# Patient Record
Sex: Male | Born: 1935 | Race: White | Hispanic: No | Smoking: Former smoker
Health system: Southern US, Community
[De-identification: ages and names within clinical notes are randomized; demographics above are authoritative.]

## PROBLEM LIST (undated history)

## (undated) DIAGNOSIS — I4891 Unspecified atrial fibrillation: Secondary | ICD-10-CM

## (undated) DIAGNOSIS — I472 Ventricular tachycardia: Secondary | ICD-10-CM

## (undated) DIAGNOSIS — I671 Cerebral aneurysm, nonruptured: Secondary | ICD-10-CM

## (undated) DIAGNOSIS — I10 Essential (primary) hypertension: Secondary | ICD-10-CM

## (undated) DIAGNOSIS — I509 Heart failure, unspecified: Secondary | ICD-10-CM

## (undated) DIAGNOSIS — I428 Other cardiomyopathies: Secondary | ICD-10-CM

## (undated) DIAGNOSIS — I442 Atrioventricular block, complete: Secondary | ICD-10-CM

## (undated) HISTORY — DX: Essential (primary) hypertension: I10

## (undated) HISTORY — DX: Unspecified atrial fibrillation: I48.91

## (undated) HISTORY — DX: Cerebral aneurysm, nonruptured: I67.1

## (undated) HISTORY — DX: Other cardiomyopathies: I42.8

## (undated) HISTORY — DX: Atrioventricular block, complete: I44.2

## (undated) HISTORY — DX: Heart failure, unspecified: I50.9

## (undated) HISTORY — PX: CARDIAC DEFIBRILLATOR PLACEMENT: SHX171

## (undated) HISTORY — DX: Ventricular tachycardia: I47.2

## (undated) HISTORY — PX: CEREBRAL ANEURYSM REPAIR: SHX164

---

## 1992-05-04 DIAGNOSIS — I671 Cerebral aneurysm, nonruptured: Secondary | ICD-10-CM

## 1992-05-04 HISTORY — DX: Cerebral aneurysm, nonruptured: I67.1

## 2005-03-31 ENCOUNTER — Ambulatory Visit: Payer: Self-pay | Admitting: Internal Medicine

## 2005-05-05 ENCOUNTER — Ambulatory Visit (HOSPITAL_COMMUNITY): Admission: RE | Admit: 2005-05-05 | Discharge: 2005-05-06 | Payer: Self-pay | Admitting: Internal Medicine

## 2005-05-05 ENCOUNTER — Ambulatory Visit: Payer: Self-pay | Admitting: Internal Medicine

## 2005-05-18 ENCOUNTER — Ambulatory Visit: Payer: Self-pay

## 2005-07-28 ENCOUNTER — Ambulatory Visit: Payer: Self-pay | Admitting: Internal Medicine

## 2005-10-26 ENCOUNTER — Ambulatory Visit: Payer: Self-pay | Admitting: Internal Medicine

## 2006-04-19 ENCOUNTER — Ambulatory Visit: Payer: Self-pay | Admitting: Cardiology

## 2006-09-24 ENCOUNTER — Ambulatory Visit: Payer: Self-pay | Admitting: Internal Medicine

## 2007-01-14 ENCOUNTER — Ambulatory Visit: Payer: Self-pay | Admitting: Internal Medicine

## 2007-04-22 ENCOUNTER — Ambulatory Visit: Payer: Self-pay | Admitting: Internal Medicine

## 2007-07-26 ENCOUNTER — Ambulatory Visit: Payer: Self-pay | Admitting: Internal Medicine

## 2007-10-24 ENCOUNTER — Ambulatory Visit: Payer: Self-pay | Admitting: Internal Medicine

## 2008-01-30 ENCOUNTER — Ambulatory Visit: Payer: Self-pay | Admitting: Internal Medicine

## 2008-05-25 ENCOUNTER — Ambulatory Visit: Payer: Self-pay | Admitting: Internal Medicine

## 2008-05-28 ENCOUNTER — Encounter: Payer: Self-pay | Admitting: Internal Medicine

## 2008-08-20 ENCOUNTER — Ambulatory Visit: Payer: Self-pay | Admitting: Internal Medicine

## 2008-08-30 ENCOUNTER — Encounter (INDEPENDENT_AMBULATORY_CARE_PROVIDER_SITE_OTHER): Payer: Self-pay | Admitting: *Deleted

## 2008-11-19 ENCOUNTER — Ambulatory Visit: Payer: Self-pay | Admitting: Internal Medicine

## 2008-11-19 ENCOUNTER — Encounter: Payer: Self-pay | Admitting: Internal Medicine

## 2008-11-21 ENCOUNTER — Encounter: Payer: Self-pay | Admitting: Internal Medicine

## 2009-01-30 DIAGNOSIS — Z95810 Presence of automatic (implantable) cardiac defibrillator: Secondary | ICD-10-CM | POA: Insufficient documentation

## 2009-01-30 DIAGNOSIS — I472 Ventricular tachycardia: Secondary | ICD-10-CM | POA: Insufficient documentation

## 2009-01-30 DIAGNOSIS — I429 Cardiomyopathy, unspecified: Secondary | ICD-10-CM | POA: Insufficient documentation

## 2009-01-30 DIAGNOSIS — I5022 Chronic systolic (congestive) heart failure: Secondary | ICD-10-CM | POA: Insufficient documentation

## 2009-02-18 ENCOUNTER — Ambulatory Visit: Payer: Self-pay | Admitting: Internal Medicine

## 2009-03-11 ENCOUNTER — Encounter: Payer: Self-pay | Admitting: Internal Medicine

## 2009-06-14 ENCOUNTER — Ambulatory Visit: Payer: Self-pay | Admitting: Internal Medicine

## 2009-06-14 DIAGNOSIS — I442 Atrioventricular block, complete: Secondary | ICD-10-CM | POA: Insufficient documentation

## 2009-06-14 DIAGNOSIS — I4891 Unspecified atrial fibrillation: Secondary | ICD-10-CM | POA: Insufficient documentation

## 2009-09-09 ENCOUNTER — Ambulatory Visit: Payer: Self-pay | Admitting: Internal Medicine

## 2009-09-11 ENCOUNTER — Encounter: Payer: Self-pay | Admitting: Internal Medicine

## 2009-12-12 ENCOUNTER — Ambulatory Visit: Payer: Self-pay | Admitting: Internal Medicine

## 2009-12-23 ENCOUNTER — Encounter: Payer: Self-pay | Admitting: Internal Medicine

## 2010-03-20 ENCOUNTER — Ambulatory Visit: Payer: Self-pay | Admitting: Internal Medicine

## 2010-03-26 ENCOUNTER — Encounter: Payer: Self-pay | Admitting: Internal Medicine

## 2010-06-03 NOTE — Assessment & Plan Note (Signed)
Summary: St. Jude device check  recv letter vs   Visit Type:  Pacemaker check Primary Provider:  Dr. Jonelle Sports  CC:  pacercheck.  History of Present Illness: The patient presents today for routine electrophysiology followup. He reports doing very well since last being seen in our clinic. The patient denies symptoms of palpitations, chest pain, shortness of breath, orthopnea, PND, lower extremity edema, dizziness, presyncope, syncope, or neurologic sequela. The patient is tolerating medications without difficulties and is otherwise without complaint today.   Preventive Screening-Counseling & Management  Alcohol-Tobacco     Smoking Status: quit     Year Quit: 1993  Current Medications (verified): 1)  Altace 10 Mg Caps (Ramipril) .... Take 1 Tablet By Mouth Once A Day 2)  Coreg 25 Mg Tabs (Carvedilol) .... Take 1 Tablet By Mouth Two Times A Day 3)  Glipizide 10 Mg Tabs (Glipizide) .... Take 1 Tablet By Mouth Once A Day 4)  Metformin Hcl 1000 Mg Tabs (Metformin Hcl) .... Take 1 Tablet By Mouth Once A Day 5)  Warfarin Sodium 5 Mg Tabs (Warfarin Sodium) .... Take 1 Tablet By Mouth Once A Day 6)  Vitamin D3 400 Unit Tabs (Cholecalciferol) .... Take 2 Tablet By Mouth Once A Day 7)  Vytorin 10-40 Mg Tabs (Ezetimibe-Simvastatin) .... Take 1 Tablet By Mouth Once A Day  Allergies (verified): 1)  ! Ancef  Comments:  Nurse/Medical Assistant: [name] medications and allergies were reviewed with the patient and were updated in the Medication and Allergy Lists. List reviewed.  Past History:  Past Medical History: 1. Nonischemic cardiomyopathy. 2. Nonsustained ventricular tachycardia. 3. Status post cardiac resynchronization therapy with defibrillator. 4. Chronic congestive heart failure - systolic now class I-II.  5. Afib 6. Complete heart block  Social History: Reviewed history from 01/30/2009 and no changes required. Retired  Married  Tobacco Use - Former.  Alcohol Use -  no Drug Use - no  Vital Signs:  Patient profile:   77 year old male Height:      69 inches Weight:      178 pounds BMI:     26.38 Pulse rate:   76 / minute BP sitting:   124 / 76  (left arm) Cuff size:   regular  Vitals Entered By: Carlye Grippe (June 14, 2009 2:24 PM)  Nutrition Counseling: Patient's BMI is greater than 25 and therefore counseled on weight management options. CC: pacercheck   Physical Exam  General:  Well developed, well nourished, in no acute distress. Head:  normocephalic and atraumatic Eyes:  PERRLA/EOM intact; conjunctiva and lids normal. Mouth:  Teeth, gums and palate normal. Oral mucosa normal. Neck:  Neck supple, no JVD. No masses, thyromegaly or abnormal cervical nodes. Lungs:  Clear bilaterally to auscultation and percussion. Heart:  RRR, no m/r/g Abdomen:  Bowel sounds positive; abdomen soft and non-tender without masses, organomegaly, or hernias noted. No hepatosplenomegaly. Msk:  Back normal, normal gait. Muscle strength and tone normal. Pulses:  pulses normal in all 4 extremities Extremities:  No clubbing or cyanosis. Neurologic:  Alert and oriented x 3. Skin:  Intact without lesions or rashes. Cervical Nodes:  no significant adenopathy Psych:  Normal affect.    ICD Specifications Following MD:  Hillis Range, MD     ICD Vendor:  St Jude     ICD Model Number:  [id number]     ICD Serial Number:  [id number] ICD DOI:  05/05/2005     ICD Implanting MD:  Sherryl Manges, MD  Lead  1:    Location: RA     DOI: 05/05/2005     Model #: [id number]     Serial #: [id number]     Status: active Lead 2:    Location: RV     DOI: 05/05/2005     Model #: 7001     Serial #: [id number]     Status: active Lead 3:    Location: LV     DOI: 05/05/2004     Model #: 5621     Serial #: [id number]     Status: active  Indications::  NICM, NSVT   ICD Follow Up Remote Check?  No Battery Voltage:  2.56 V     Charge Time:  13.3 seconds     Underlying rhythm:  CHB ICD Dependent:  Yes        ICD Device Measurements Atrium:  Amplitude: 3 mV, Impedance: 480 ohms, Threshold: 0.75 V at 0.5 msec Right Ventricle:  Amplitude: PACED AT 30 mV, Impedance: 370 ohms, Threshold: 1.0 V at 0.5 msec Left Ventricle:  Impedance: 450 ohms, Threshold: 1.0 V at 0.5 msec Shock Impedance: 41 ohms   Episodes MS Episodes:  1     Percent Mode Switch:  <1%     Coumadin:  Yes Shock:  0     ATP:  0     Nonsustained:  0     Atrial Pacing:  9%     Ventricular Pacing:  >99%  Brady Parameters Mode DDDR     Lower Rate Limit:  60     Upper Rate Limit 120 PAV 200     Sensed AV Delay:  160  Tachy Zones VF:  222     VT:  200     VT1:  160     Next Remote Date:  09/09/2009     Next Cardiology Appt Due:  06/04/2010 Tech Comments:  Normal device function.  No changes made today.  Pt does Merlin transmisisons.  ROV 12 months JA. Gypsy Balsam RN BSN  June 14, 2009 2:38 PM  MD Comments:  agree  Impression & Recommendations:  Problem # 1:  ICD - IN SITU (ICD-V45.02) Normal BiV ICD function. No changes today.  Problem # 2:  SYSTOLIC HEART FAILURE, CHRONIC (ICD-428.22) stable without symptoms of CHF. Continue med managment His updated medication list for this problem includes:    Altace 10 Mg Caps (Ramipril) .Marland Kitchen... Take 1 tablet by mouth once a day    Coreg 25 Mg Tabs (Carvedilol) .Marland Kitchen... Take 1 tablet by mouth two times a day    Warfarin Sodium 5 Mg Tabs (Warfarin sodium) .Marland Kitchen... Take 1 tablet by mouth once a day  Problem # 3:  AV BLOCK, COMPLETE (ICD-426.0) stable with BiV pacing.  His updated medication list for this problem includes:    Altace 10 Mg Caps (Ramipril) .Marland Kitchen... Take 1 tablet by mouth once a day    Coreg 25 Mg Tabs (Carvedilol) .Marland Kitchen... Take 1 tablet by mouth two times a day    Warfarin Sodium 5 Mg Tabs (Warfarin sodium) .Marland Kitchen... Take 1 tablet by mouth once a day  Problem # 4:  ATRIAL FIBRILLATION (ICD-427.31) stable continue coumadin. no significant Afib by ICD interrogation. His updated  medication list for this problem includes:    Coreg 25 Mg Tabs (Carvedilol) .Marland Kitchen... Take 1 tablet by mouth two times a day    Warfarin Sodium 5 Mg Tabs (Warfarin sodium) .Marland Kitchen... Take 1 tablet by mouth once a day  Patient Instructions: 1)  continue close follow-up with Dr Hyacinth Meeker. 2)  remote device checks every 3 months 3)  return in 1 year

## 2010-06-03 NOTE — Letter (Signed)
Summary: Remote Device Check  [facility], Main Office  [address]   [place], [place] [address]   Phone: [phone]  Fax: [phone]     March 26, 2010 MRN: [id number]   George Medina [address] [place], [place]  [address]   Dear Mr. Sullivant,   Your remote transmission was recieved and reviewed by your physician.  All diagnostics were within normal limits for you.   __X____Your next office visit is scheduled for:   February 2012 with Dr Johney Frame in [place]. Please call our office to schedule an appointment.    Sincerely,  Vella Kohler

## 2010-06-03 NOTE — Cardiovascular Report (Signed)
Summary: Office Visit Remote   Office Visit Remote   Imported By: Roderic Ovens 04/07/2010 14:48:37  _____________________________________________________________________  External Attachment:    Type:   Image     Comment:   External Document

## 2010-06-03 NOTE — Cardiovascular Report (Signed)
Summary: Office Visit Remote   Office Visit Remote   Imported By: Roderic Ovens 12/24/2009 15:37:15  _____________________________________________________________________  External Attachment:    Type:   Image     Comment:   External Document

## 2010-06-03 NOTE — Cardiovascular Report (Signed)
Summary: Card Device Clinic/ [facility] SUMMARY  Card Device Clinic/ [facility] SUMMARY   Imported By: Dorise Hiss 06/17/2009 11:48:31  _____________________________________________________________________  External Attachment:    Type:   Image     Comment:   External Document

## 2010-06-03 NOTE — Cardiovascular Report (Signed)
Summary: Office Visit Remote   Office Visit Remote   Imported By: Roderic Ovens 09/11/2009 13:38:01  _____________________________________________________________________  External Attachment:    Type:   Image     Comment:   External Document

## 2010-06-03 NOTE — Letter (Signed)
Summary: Remote Device Check  [facility], Main Office  [address]   [place], [place] [address]   Phone: [phone]  Fax: [phone]     Sep 11, 2009 MRN: [id number]   George Medina [address] [place], [place]  [address]   Dear [name],   Your remote transmission was recieved and reviewed by your physician.  All diagnostics were within normal limits for you.  __X___Your next transmission is scheduled for:   12-12-09.  Please transmit at any time this day.  If you have a wireless device your transmission will be sent automatically.    Sincerely,  Vella Kohler

## 2010-06-03 NOTE — Letter (Signed)
Summary: Remote Device Check  [facility], Main Office  [address]   [place], [place] [address]   Phone: [phone]  Fax: [phone]     December 23, 2009 MRN: [id number]   George Medina [address] [place], [place]  [address]   Dear George Medina,   Your remote transmission was recieved and reviewed by your physician.  All diagnostics were within normal limits for you.  __X___Your next transmission is scheduled for: 03-20-2010.  Please transmit at any time this day.  If you have a wireless device your transmission will be sent automatically.   Sincerely,  Vella Kohler

## 2010-06-23 ENCOUNTER — Encounter: Payer: Self-pay | Admitting: Internal Medicine

## 2010-06-23 ENCOUNTER — Encounter (INDEPENDENT_AMBULATORY_CARE_PROVIDER_SITE_OTHER): Payer: Medicare Other | Admitting: Internal Medicine

## 2010-06-23 DIAGNOSIS — I442 Atrioventricular block, complete: Secondary | ICD-10-CM

## 2010-06-23 DIAGNOSIS — I10 Essential (primary) hypertension: Secondary | ICD-10-CM | POA: Insufficient documentation

## 2010-06-23 DIAGNOSIS — I5022 Chronic systolic (congestive) heart failure: Secondary | ICD-10-CM

## 2010-06-23 DIAGNOSIS — I428 Other cardiomyopathies: Secondary | ICD-10-CM

## 2010-06-23 DIAGNOSIS — I4891 Unspecified atrial fibrillation: Secondary | ICD-10-CM

## 2010-07-01 NOTE — Assessment & Plan Note (Signed)
Summary: SJM.Marland KitchenICD/LG   Visit Type:  Follow-up Primary Provider:  Dr. Jonelle Sports  CC:  ICD.  History of Present Illness: The patient presents today for routine electrophysiology followup. He reports doing very well since last being seen in our clinic. The patient denies symptoms of palpitations, chest pain, shortness of breath, orthopnea, PND, lower extremity edema, dizziness, presyncope, syncope, or neurologic sequela. The patient is tolerating medications without difficulties and is otherwise without complaint today.   Current Medications (verified): 1)  Altace 10 Mg Caps (Ramipril) .... Take 1 Tablet By Mouth Once A Day 2)  Coreg 25 Mg Tabs (Carvedilol) .... Take 1 Tablet By Mouth Two Times A Day 3)  Glipizide 10 Mg Tabs (Glipizide) .... 1/2 By Mouth Daily 4)  Metformin Hcl 1000 Mg Tabs (Metformin Hcl) .... Take 1 Tablet By Mouth Once A Day 5)  Warfarin Sodium 5 Mg Tabs (Warfarin Sodium) .... Take 1 Tablet By Mouth Once A Day 6)  Vitamin D3 400 Unit Tabs (Cholecalciferol) .... Take 2 Tablet By Mouth Once A Day 7)  Januvia 100 Mg Tabs (Sitagliptin Phosphate) .Marland Kitchen.. 1 By Mouth Daily  Allergies (verified): 1)  ! Ancef  Past History:  Past Medical History: Reviewed history from 06/14/2009 and no changes required. 1. Nonischemic cardiomyopathy. 2. Nonsustained ventricular tachycardia. 3. Status post cardiac resynchronization therapy with defibrillator. 4. Chronic congestive heart failure - systolic now class I-II.  5. Afib 6. 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 Retired  Married  Tobacco Use - Former.  Alcohol Use - no Drug Use - no  Review of Systems       All systems are reviewed and negative except as listed in the HPI.   Vital Signs:  Patient profile:   77 year old male Height:      69 inches Weight:      181 pounds BMI:     26.83 Pulse  rate:   90 / minute Resp:     16 per minute BP sitting:   151 / 75  (left arm)  Vitals Entered By: Marrion Coy, CNA (June 23, 2010 3:44 PM)  Physical Exam  General:  Well developed, well nourished, in no acute distress. Head:  normocephalic and atraumatic Eyes:  PERRLA/EOM intact; conjunctiva and lids normal. Mouth:  Teeth, gums and palate normal. Oral mucosa normal. Neck:  supple Chest Wall:  ICD pocket is well healed Lungs:  Clear bilaterally to auscultation and percussion. Heart:  Non-displaced PMI, chest non-tender; regular rate and rhythm, S1, S2 without murmurs, rubs or gallops. Carotid upstroke normal, no bruit. Normal abdominal aortic size, no bruits. Femorals normal pulses, no bruits. Pedals normal pulses. No edema, no varicosities. Abdomen:  Bowel sounds positive; abdomen soft and non-tender without masses, organomegaly, or hernias noted. No hepatosplenomegaly. Msk:  Back normal, normal gait. Muscle strength and tone normal. Extremities:  No clubbing or cyanosis. Neurologic:  Alert and oriented x 3. Skin:  Intact without lesions or rashes. Psych:  Normal affect.    ICD Specifications Following MD:  Hillis Range, MD     ICD Vendor:  St Jude     ICD Model Number:  [id number]     ICD Serial Number:  [id number] ICD DOI:  05/05/2005     ICD Implanting MD:  Sherryl Manges, MD  Lead 1:    Location: RA     DOI: 05/05/2005  Model #: [id number]     Serial #: [id number]     Status: active Lead 2:    Location: RV     DOI: 05/05/2005     Model #: 7001     Serial #: [id number]     Status: active Lead 3:    Location: LV     DOI: 05/05/2004     Model #: 5409     Serial #: [id number]     Status: active  Indications::  NICM, NSVT   ICD Follow Up ICD Dependent:  Yes      Episodes Coumadin:  Yes  Brady Parameters Mode DDDR     Lower Rate Limit:  60     Upper Rate Limit 120 PAV 200     Sensed AV Delay:  160  Tachy Zones VF:  222     VT:  200     VT1:  160     MD Comments:  see scanned  report  Impression & Recommendations:  Problem # 1:  SYSTOLIC HEART FAILURE, CHRONIC (ICD-428.22) stable without sypmtomats of CHF today ICD function is normal but approaching ERI.  No changes today.  We will follow with monthly merlin checks and have pt return to our clinic in 3 months.  He has a SJM Riata model 7001 lead.  We will need to make a decision about whether to replace this lead prior to generator change.  Problem # 2:  AV BLOCK, COMPLETE (ICD-426.0) normal device function no changes today  Problem # 3:  ATRIAL FIBRILLATION (ICD-427.31) stable maintaining sinus rhythm goal INR 2-3  Problem # 4:  ESSENTIAL HYPERTENSION, BENIGN (ICD-401.1) above goal salt restriction advised

## 2010-07-01 NOTE — Cardiovascular Report (Signed)
Summary: Card Device Clinic/ [facility] SUMMARY  Card Device Clinic/ [facility] SUMMARY   Imported By: Dorise Hiss 06/24/2010 16:52:28  _____________________________________________________________________  External Attachment:    Type:   Image     Comment:   External Document

## 2010-09-16 NOTE — Cardiovascular Report (Signed)
[facility]                   [place] ELECTROPHYSIOLOGY DEVICE CLINIC NOTE   ARDA, KEADLE                         MRN:          [id number]  DATE:05/25/2008                            DOB:          Sep 05, 1929    Mr. George Medina is seen in followup for congestive heart failure in the  setting of nonischemic cardiomyopathy.  He is status post CRTD  implantation and is feeling really quite well.  He has no complaints of  chest pain, shortness of breath, or peripheral edema.   His medications include Coumadin, Coreg, metformin, glipizide, Altace,  Vytorin, furosemide.   PHYSICAL EXAMINATION:  VITAL SIGNS:  His blood pressure today was  129/74, the pulse of 74.  LUNGS:  Clear.  HEART:  Sounds were regular.  EXTREMITIES:  Without edema.   Interrogation of his St. Jude Ellis ICD demonstrates the P-wave of 3  with impedance of 465, a threshold of 0.5 at 0.5 in the RA and RV.  There is no intrinsic ventricular rhythm.  The RV impedance was 410, LV  impedance was 413, and the LV threshold was 0.75 at 0.5.  Battery  voltage 2.65.  He is 100% ventricularly paced.   IMPRESSION:  1. Nonischemic cardiomyopathy.  2. Nonsustained ventricular tachycardia.  3. Status post cardiac resynchronization therapy with defibrillator.  4. Chronic congestive heart failure - systolic now class I-II.   Mr. [name] is doing really very well.  We will plan to see him again in  1 year's time.  He will follow up with Dr. Hyacinth Meeker in the interim.     Duke Salvia, MD, [facility]  Electronically Signed    SCK/MedQ  DD: 05/25/2008  DT: 05/26/2008  Job #: [id number]   cc:   Jillyn Hidden Dr. Hyacinth Meeker

## 2010-09-16 NOTE — Letter (Signed)
January 14, 2007    Pincus Sanes, M.D.  [place], [place] [address]   RE:  NATHANIEL, WAKELEY  MRN:  [id number]  /  DOB:  17-Mar-1930   Dear Henreitta Cea:   I hope this letter finds you well.  It was a pleasure to see your  patient, Husain Costabile today.   As you know, he is status post CRT-D implantation in the context of his  nonischemic cardiomyopathy, congestive heart failure.  He continues to  thrive, now with class II symptoms.   MEDICATIONS:  1. Coreg 25 b.i.d.  2. Metformin 1000 b.i.d.  3. Glipizide 5.  4. Protonix/Altace 10.  5. Vytorin 40.  6. Furosemide 20.  7. Coumadin.   EXAMINATION:  Blood pressure 136/76, pulse 68.  Neck veins were flat.  LUNGS:  Clear.  HEART:  Sounds are regular without murmurs or gallops.  ABDOMEN:  Soft with active bowel sounds.  EXTREMITIES:  Without edema.   Interrogation of his [employer]ch[employer], retail defibrillator demonstrates no  intrinsic ventricular rhythm.  Impedance was 390 and threshold of 0.5 at  0.5 in the RV and 0.75 at 0.5 in the LV with a 485 LV impedance.  The RA  impedance was 440 with a threshold at 0.5 at 0.5 and P wave amplitude  was greater than 3.  There were no intercurrent episodes.   IMPRESSION:  1. Complete heart block.  2. Nonischemic cardiomyopathy.  3. Congestive heart failure higher class III.  4. Status post CRT-D for the above.   Mr. Averi, Cacioppo, is doing really very well.  If there is anything I  can do to help in his care of anything else, please do not hesitate to  contact me.    Sincerely,      Duke Salvia, MD, [facility]  Electronically Signed    SCK/MedQ  DD: 01/14/2007  DT: 01/15/2007  Job #: [id number]

## 2010-09-16 NOTE — Cardiovascular Report (Signed)
[facility]                   [place] [facility] DEVICE CLINIC NOTE   George Medina, George Medina                         MRN:          [id number]  DATE:09/24/2006                            DOB:          [birth year]    George Medina is seen, he is status post CRT implantation for congestive  heart failure in the setting of nonischemic heart disease. He remains  doing really quite well with class 2 symptoms.   MEDICATIONS:  Coumadin, Coreg, metformin, glipizide, Altace, Vytorin and  furosemide.   PHYSICAL EXAMINATION:  VITAL SIGNS:  His blood pressure was 119/68 with  a pulse of 65.  LUNGS:  Clear.  HEART:  Heart sounds were regular.  EXTREMITIES:  Without edema.   Interrogation of his St. Jude Atlas [phone] demonstrates a P wave of 3 with  impedance of 45, a threshold of 0.5 at 0.5. There was no intrinsic  ventricular rhythm. The RV impedance was 405 with a threshold of 0.5 at  0.5, the LV impedance was 510 with threshold of 0.75 at 0.7, battery  voltage 3.15. He is atrial paced 70% of the time and 100% ventricularly  paced. There were no intercurrent episodes of tachycardia.   IMPRESSION:  1. Nonischemic cardiomyopathy.  2. Class 3 now class 2 congestive heart failure.  3. Status post CRT for #2 and #1.   George Medina is stable and will see him again in 4 months' time if he does  not want to undertake telephone or remote followup.     Duke Salvia, MD, [facility]  Electronically Signed    SCK/MedQ  DD: 09/24/2006  DT: 09/24/2006  Job #: [id number]   cc:   George November, MD

## 2010-09-17 ENCOUNTER — Encounter: Payer: Self-pay | Admitting: *Deleted

## 2010-09-18 ENCOUNTER — Telehealth: Payer: Self-pay | Admitting: *Deleted

## 2010-09-18 ENCOUNTER — Encounter: Payer: Self-pay | Admitting: *Deleted

## 2010-09-18 ENCOUNTER — Ambulatory Visit (INDEPENDENT_AMBULATORY_CARE_PROVIDER_SITE_OTHER): Payer: Medicare Other | Admitting: Internal Medicine

## 2010-09-18 ENCOUNTER — Encounter: Payer: Self-pay | Admitting: Internal Medicine

## 2010-09-18 DIAGNOSIS — I428 Other cardiomyopathies: Secondary | ICD-10-CM

## 2010-09-18 DIAGNOSIS — R0602 Shortness of breath: Secondary | ICD-10-CM

## 2010-09-18 DIAGNOSIS — Z7901 Long term (current) use of anticoagulants: Secondary | ICD-10-CM

## 2010-09-18 DIAGNOSIS — I4891 Unspecified atrial fibrillation: Secondary | ICD-10-CM

## 2010-09-18 DIAGNOSIS — I442 Atrioventricular block, complete: Secondary | ICD-10-CM

## 2010-09-18 DIAGNOSIS — Z95810 Presence of automatic (implantable) cardiac defibrillator: Secondary | ICD-10-CM

## 2010-09-18 NOTE — Assessment & Plan Note (Signed)
As above Pulse generator replacement planned He has no underlying rhythm today.

## 2010-09-18 NOTE — Assessment & Plan Note (Signed)
Stable Normal ICD function He has reached ERI battery status He has a SJM 7001 RV lead, though it has been functioning normally. See Arita Miss Art report  R/B/A to BiV ICD pulse generator replacement were discussed at length today.  The patient is aware that we will fluoro his RV lead and consider replacement if abnormal.  He would however like to avoid RV lead replacement if able but is willing to proceed.

## 2010-09-18 NOTE — Telephone Encounter (Signed)
PT CALLED WANTING  TO KNOW WHAT PAPER  TO GIVE TO  LAB AND WHERE IS [facility]  INFORMED PT NOT SURE WHICH PAPER  TO GIVE  TO LAB TECH  BUT THE [facility] IS [facility]CROSS FORM [facility] PT STATED   WILL RUN BY [place] OFFICE TO GET CLARIFICATION TOM. PT WAS SEEN BY DR Johney Frame IN [place] TODAY  ./CY

## 2010-09-18 NOTE — Patient Instructions (Signed)
Your physician recommends that you go to the [facility] for lab work: DO TODAY Return to our office ___________________________ to have your Coumadin checked. Go to Short Stay at [facility] ____________________________ for generator change out.

## 2010-09-18 NOTE — Assessment & Plan Note (Signed)
Stable No changes today Continue warfarin.

## 2010-09-18 NOTE — Progress Notes (Signed)
The patient presents today for routine electrophysiology followup.  Since last being seen in our clinic, the patient reports doing very well.  Today, he denies symptoms of palpitations, chest pain, shortness of breath, orthopnea, PND, lower extremity edema, dizziness, presyncope, syncope, or neurologic sequela.  The patient feels that he is tolerating medications without difficulties and is otherwise without complaint today.   Past Medical History  Diagnosis Date  . Nonischemic cardiomyopathy   . Nonsustained ventricular tachycardia   . CHF (congestive heart failure)     chronic-systolic now class I-II  . A-fib   . Complete heart block   . Hypertension   . Diabetes mellitus   . Brain aneurysm 1994    required surgery x2   Past Surgical History  Procedure Date  . Cardiac defibrillator placement 05/2005    St. Jude atlas dual chamber biventricular  . Cerebral aneurysm repair     x2    Current Outpatient Prescriptions  Medication Sig Dispense Refill  . carvedilol (COREG) 25 MG tablet Take 25 mg by mouth 2 (two) times daily with a meal.        . cholecalciferol (DELTA D3) 400 UNITS TABS Take 1,000 Units by mouth daily.       Marland Kitchen glipiZIDE (GLUCOTROL) 10 MG tablet Take 5 mg by mouth 2 (two) times daily before a meal.       . metFORMIN (GLUCOPHAGE) 1000 MG tablet Take 1,000 mg by mouth daily with breakfast.        . ramipril (ALTACE) 10 MG capsule Take 10 mg by mouth daily.        . sitaGLIPtan (JANUVIA) 100 MG tablet Take 100 mg by mouth daily.        Marland Kitchen warfarin (COUMADIN) 5 MG tablet Take 5 mg by mouth daily. Take 1 tablet Monday to Friday not on Saturday.        Allergies  Allergen Reactions  . Cefazolin   . Penicillins     History   Social History  . Marital Status: Married    Spouse Name: N/A    Number of Children: N/A  . Years of Education: N/A   Occupational History  . RETIRED    Social History Main Topics  . Smoking status: Former Smoker    Quit date: 05/05/1991    . Smokeless tobacco: Not on file  . Alcohol Use: No  . Drug Use: No  . Sexually Active:    Other Topics Concern  . Not on file   Social History Narrative  . No narrative on file    Family History  Problem Relation Age of Onset  . Diabetes Sister   . Heart disease Brother   . Hypertension Brother   . Coronary artery disease Other     ROS-  All systems are reviewed and are negative except as outlined in the HPI above  Physical Exam: Filed Vitals:   09/18/10 1332  BP: 121/70  Pulse: 80  Resp: 18  Height: 5\' 9"  (1.753 m)  Weight: 180 lb (81.647 kg)  SpO2: 94%    GEN- The patient is well appearing, alert and oriented x 3 today.   Head- normocephalic, atraumatic Eyes-  Sclera clear, conjunctiva pink Ears- hearing intact Oropharynx- clear Neck- supple, no JVP Lymph- no cervical lymphadenopathy Lungs- Clear to ausculation bilaterally, normal work of breathing Chest- ICD pocket is well healed Heart- Regular rate and rhythm, no murmurs, rubs or gallops, PMI not laterally displaced GI- soft, NT, ND, + BS  Extremities- no clubbing, cyanosis, or edema MS- no significant deformity or atrophy Skin- no rash or lesion Psych- euthymic mood, full affect Neuro- strength and sensation are intact  ICD interrogation- reviewed in detail today,  See [facility] report  Assessment and Plan:

## 2010-09-19 ENCOUNTER — Telehealth: Payer: Self-pay | Admitting: *Deleted

## 2010-09-19 NOTE — Discharge Summary (Signed)
George Medina, George Medina NO.:  [id number]   MEDICAL RECORD NO.:  [id number]          PATIENT TYPE:  INP   LOCATION:  6525                         FACILITY:  [facility]   PHYSICIAN:  Duke Salvia, M.D.  DATE OF BIRTH:  07/29/1929   DATE OF ADMISSION:  05/05/2005  DATE OF DISCHARGE:  05/06/2005                                 DISCHARGE SUMMARY   ALLERGIES:  ANCEF AND PENICILLIN.   PRINCIPAL DIAGNOSES:  1.  Nonischemic cardiomyopathy, ejection fraction 10-15% at catheterization      October 2006.  2.  Nonsustained ventricular tachycardia with high burden of premature      ventricular contractions.  3.  Left bundle branch block.  4.  Discharging day 1, status post implantation of St. Jude Atlas II HF Bi-V      cardioverter defibrillator.   SECONDARY DIAGNOSES:  1.  Hypertension.  2.  Diabetes.  3.  History of cerebral aneurysm.  4.  Class III congestive heart failure.   PROCEDURE:  May 04, 2005:  Implantation of Atlas II HF cardioverter  defibrillator with placement of left ventricular lead.  This is a CRT-D  placement.  Difficult but successful.  Dr. Sherryl Manges, practitioner.   BRIEF HISTORY:  Mr. Dosanjh is a 77 year old gentleman.  He has a history of  congestive heart failure.  Initially diagnosed in 1994.  At that time, he  underwent cardiac catheterization and was told he had nonobstructive  coronary artery disease and a decreased ejection fraction.  He has done  quite well since then until October of this year when he developed severe  congestive heart failure symptoms which were manifest by shortness of  breath, exercise intolerance and edema.  The patient was treated in the  hospital with diuretics, added an ACE inhibitor, beta blocker, and underwent  a subsequent cardiac catheterization which showed nonobstructive disease and  ejection fraction which had decreased to 10-15%.  There was no evidence of  mitral stenosis.  The patient is noted to have  nonsustained ventricular  tachycardia.  He has no history of syncope or presyncope.  He does have some  feeling of palpitation.  The patient qualifies for implantation of Bi-V  cardioverter defibrillator with Class III congestive heart failure, left  bundle branch block, nonischemic cardiomyopathy.   HOSPITAL COURSE:  The patient presented electively on May 05, 2005.  He  underwent implantation of cardioverter defibrillator with left ventricular  lead augmentation.  The left ventricular lead was placed with difficulty but  was successful. The device has been interrogated post-procedure day #1.  No  changes were made.  A chest x-ray showed lead in appropriate position.  Mobility of the left arm has been discussed with the patient.  He will  return for follow-up care.   FOLLOW-UP APPOINTMENTS:  1.  ICD Clinic at [facility], [address], May 18, 2005 at 9 o'clock.  A basic metabolic panel will be taken at that time.  2.  He will also seek follow up with Dr. Sherryl Manges April 10 at  12:20.   He is asked to keep his incision dry for the next seven days, sponge bathe  until Tuesday, January 9.  He is asked not to drive for the next week and  not to lift anything heavier than 10 pounds for the next four weeks.   MEDICATIONS:  1.  For pain management, a prescription for Darvocet-N 100 one to two tabs      every 3-4 hours has been given.  He is to resume his Coumadin 5 mg      daily.  2.  Hold his metformin 500 mg tablets until Friday, May 08, 2005.  3.  Altace 10 mg daily.  4.  Vytorin 10/40 daily at bedtime.  5.  Prevacid 30 mg twice daily.  6.  Furosemide 20 mg daily.  7.  Coreg 12.5 mg twice daily.   Also, he is to return to see Dr. Daryel November in 3-4 weeks.   LABORATORY STUDIES THIS ADMISSION:  Complete blood count:  White cells 6.5,  hemoglobin 15.4, hematocrit 44.4 and platelets 217.  Serum electrolytes:  Sodium is 139, potassium is 5.5,  chloride 105, carbon dioxide 21, BUN is 19,  creatinine 1.2 and glucose 128.  PT was 14.7, INR 1.1, PTT was 35.      Maple Mirza, P.A.    ______________________________  Duke Salvia, M.D.    GM/MEDQ  D:  05/06/2005  T:  05/06/2005  Job:  [id number]   cc:   Duke Salvia, M.D.  [address]  [address]  [place]  [place] [address]   Dr. Jonelle Sports  Internal Medicine  [place], [place]  [address] [place],  [place] [address]

## 2010-09-19 NOTE — Op Note (Signed)
George Medina, George Medina NO.:  [id number]   MEDICAL RECORD NO.:  [id number]          PATIENT TYPE:  INP   LOCATION:  2899                         FACILITY:  [facility]   PHYSICIAN:  Duke Salvia, M.D.  DATE OF BIRTH:  03-27-1930   DATE OF PROCEDURE:  05/05/2005  DATE OF DISCHARGE:                                 OPERATIVE REPORT   PREOPERATIVE DIAGNOSES:  1.  Nonischemic cardiomyopathy.  2.  Congestive heart failure.  3.  Left bundle branch block.   POSTOPERATIVE DIAGNOSES:  1.  Nonischemic cardiomyopathy.  2.  Congestive heart failure.  3.  Left bundle branch block.   PROCEDURE:  ICD implantation with CRT placement (repeated secondary to  sheath dislodgement).   Following the obtainment of informed consent, the patient was brought to the  Electrophysiology Laboratory and placed on the fluoroscopic table in the  supine position.  After routine prep and drape of the left upper chest,  lidocaine was infiltrated in the prepectoral subclavicular region.  An  incision was made and carried down to the layer of the prepectoral fascia  using electrocautery and sharp dissection.  A pocket was formed similarly,  hemostasis was obtained.   Thereafter, attention was turned to gain access to the extrathoracic left  subclavian vein which was accomplished without difficulty without the  aspiration of air or puncture of the artery.  Three separate venipunctures  were accomplished.  Guide wires were placed and retained and a 0 silk suture  was placed around the two most cephalad wires and allowed to hang loosely.   Over the guide wire was placed a 7 French sheath through which was passed a  [employer]. Jude dual coil active fixation defibrillator lead, Model [id number],  Serial [id number].  Under fluoroscopic guidance, it was manipulated to the  right ventricular apex where the bipolar R-wave was 10.4 mV with a pacing  impedance of 613 ohms, a threshold of 0.5 v at 0.5 msec,  current threshold  0.3 mA and there is no diaphragmatic pacing at 10 v.   This lead having been secured, we then turned our attention to gaining  access to the left ventricular epicardial system.  We ended up having to use  two different coronary sinus sheaths to finally allow cannulation, it was  somewhat anteriorly and vertically displaced __________.  We were then able  to cannulate it.  Venography was obtained demonstrating two very posterior  branches, a high lateral branch and an anterolateral branch.  It was elected  to pursue the lateral branch.  We ended up needing a subselection catheter  to allow for cannulation of this vein.   We placed a guide wire down and then attempted to pass a St. Jude 1058 86 cm  LV lead.  The first time we passed it through the right atrium it caused  dislodgement of the sheath.  Never having seen that before, I had not  anticipated.  We then recannulated the coronary sinus, advanced the sheath  very distally into the coronary sinus, rewired the high lateral vein and  attempted again  to pass the lead into this position.  We were with  significant difficulty able to maneuver it through the right atrium, because  every time we tried to push it across the floor of the right atrium the  sheath would move proximally towards dislodgement.  We ended up using a  buddy wire with a mailman alongside the lead within the sheath to try and  get support, and gradually we were able to finagle it into the appropriate  vein.   However, at this point it turned out that when it was distally placed enough  so that the coils of the St. Jude lead were secure we ended up with  diaphragmatic stimulation and as we moved it proximally, while there was no  diaphragmatic stimulation and good pacing thresholds, the proximal coil of  the swiggly tail resulted in proximal migration of the lead.  Attempts to  reposition with a different lead were interrupted by again dislodgement  of  the sheath as the lead passed through the right atrium.  We then had to  cannulate the coronary sinus a third time and this time we used a Medtronic  4194 88 cm passive fixation LV lead, serial #[id number].  We were able to  manipulate this lead through the RA much more easily and into the  appropriate branch.  We had no evidence of diaphragmatic stimulation and in  this location the bipolar R wave was 5.4 mV with a pacing impedance of 528  and a threshold of 0.4 v at 0.5 msec and a current threshold of 0.9 mA.  The  RA lead was then placed over the last retained guide wire through a 7 [place]  sheath.  It was a Medtronic 5076 52 cm lead, serial #[id number].  It was  __________ the right atrial appendage where the bipolar P wave was 4.5 mV  with a pacing impedance of 1033 ohms and a threshold of 2.4 v immediately  after screw deployment.  At this point, the LV lead delivery system was  removed.  There was a little bit of proximal migration of the LV lead, but  it was elected to leave the lead there as it appeared to be stable in this  location.  However, we did choose to pace the LV unipolar and in this  configuration the R wave was about 9 mV with a pacing impedance of 435 and  threshold of 0.5 at 0.5 msec as this assayed through the device.  The leads  were attached to the device, which was a Social research officer, government [id number] ICD, serial  [id number].  The RA amplitude was greater than 3 with an impedance of 660, the  threshold was now 1.5 v at 0.5 msec and the RV was greater than 12 with an  impedance of 425 with a threshold of less than 0.25 at 0.5 msec.  With these  acceptable parameters recorded, defibrillation threshold testing was  undertaken.  Ventricular fibrillation was induced via the T wave shock.  After a total duration of 7 seconds a 20 joule shock was delivered through a  measured resistance of 45 ohms, terminating ventricular fibrillation and restoring sinus rhythm.   After a wait of 5  minutes, ventricular fibrillation was reinduced via the T  wave shock.  After a total duration of 7.5 seconds, a 20 joule shock was  delivered through a measured resistance of 44 ohms, terminating ventricular  fibrillation and restoring sinus rhythm.  At this point, the device was  implanted.  The pocket was copiously irrigated with antibiotic containing  saline solution.  Hemostasis was assured.  The leads in the pulse generator  were secured to the prepectoral fascia.  The wounds were closed in 3 layers  in the normal fashion.  The wound was washed, dried and a Benzoin and Steri-  Strips dressing was applied.  Needle counts, sponge counts and instrument  counts were correct at the end of the procedure according to the staff.  The  patient tolerated the procedure without apparent complication.           ______________________________  Duke Salvia, M.D.     SCK/MEDQ  D:  05/05/2005  T:  05/05/2005  Job:  [id number]   cc:   Daryel November, Dr.  Octavio Manns, [facility]   Electrophysiology Laboratory   [facility]

## 2010-09-19 NOTE — Telephone Encounter (Signed)
Spoke with pt's dgt in [place]. She will be coming up for Gen Change on Tues. She is aware pt should come to the office on Monday am for INR to determine if Coumadin should be help Monday night and then go to [place] to have labs done. Pt's dgt is aware regarding scheduling for gen change on Tues.

## 2010-09-19 NOTE — H&P (Signed)
George Medina, George Medina NO.:  [id number]   MEDICAL RECORD NO.:  [id number]          PATIENT TYPE:  INP   LOCATION:  2899                         FACILITY:  [facility]   PHYSICIAN:  Duke Salvia, M.D.  DATE OF BIRTH:  [birth year]   DATE OF ADMISSION:  05/05/2005  DATE OF DISCHARGE:                                HISTORY & PHYSICAL   PRIMARY CARDIOLOGIST:  Duke Salvia, M.D.   PRIMARY CARE PHYSICIAN:  Dr. Jonelle Sports, [facility],  [place], [place].   HISTORY OF PRESENT ILLNESS:  George Medina is here for a procedure today.  He  is having a cardioverter by V defibrillator inserted by Dr. Berton Mount.  George Medina is a 77 year old Caucasian gentleman with a history of congestive  heart failure that initially started in 1994.  He underwent a cardiac  catheterization at that time and apparently was told he had nonobstructive  disease and a poor ejection fraction.  He had done quite well and had been  quite compensated until October of this year, when he developed severe  congestive heart failure manifested by shortness of breath, exercise  intolerance and significant edema.  The patient was hospitalized, treated  medically with diuretics, ACE inhibitor, beta blocker, and subsequently  underwent cardiac catheterization demonstrating nonobstructive disease and  an EF which had decreased to 10-15%.  Echocardiogram previously showed  global hypokinesis, severe, with an EF of 30%.  No evidence of mitral  stenosis apparently, however, was found.  Medications were further up  titrated.  Patient was stabilized with a diuretic.  He was also noted to be  have nonsustained V tach without any history of syncope or presyncope.  He  does have some palpitations.   PAST MEDICAL HISTORY:  Past medical history also includes:  1.  Hypertension.  2.  Diabetes diagnosed two years ago.  3.  Brain aneurysm back in 1994, which required surgery x2.   ALLERGIES:  1.   PENICILLIN.  2.  ANCEF.   MEDICATIONS AT HOME:  1.  Coumadin.  2.  Altace 10 mg.  3.  Vytorin 10/40.  4.  Glipizide.  5.  Metformin.  6.  Prevacid.  7.  Lasix 20.  8.  Coreg b.i.d.   SOCIAL HISTORY:  The patient lives in [place], [place], with his wife.  He  is retired.  He quit smoking in 1994.  Denies any drug, herbal medication or  EtOH use.  Tries to follow a low-sodium, diabetic diet.   FAMILY HISTORY:  He has a brother who has had an MI and hypertension, a  sister with diabetes.   REVIEW OF SYSTEMS:  Positive for dyspnea on exertion, palpitations.  Denies  any orthopnea or PND at this time.  Denies any chest pain.  Denies any  peripheral edema.  Continues to take his diuretic.  All other systems  negative per patient.   PHYSICAL EXAMINATION:  VITAL SIGNS:  Blood pressure 122/76, pulse 76.  Weight 184 pounds.  HEENT:  Pupils equal, round and reactive to light.  Sclerae are clear.  NECK:  Neck veins  are flat.  Carotids brisk and full bilaterally, without  bruits.  LUNGS:  Clear.  HEART SOUNDS:  Regular, without rubs or gallops.  ABDOMEN:  Soft, with active bowel sounds.   Distal pulse 2+ DPs bilaterally, without clubbing, cyanosis or edema.   NEUROLOGICAL:  Grossly intact.   LABORATORY WORK AVAILABLE AT THIS TIME:  CBC with a white blood cell count  of 6.5, hemoglobin 15.4, hematocrit 44.4, with platelet count of 217,000.  Sodium 139, potassium 5.5, BUN 19, creatinine 1.2, glucose 128.  PT 14.7,  INR 1.1.   IMPRESSION:  1.  Nonischemic cardiomyopathy.  2.  Class III congestive heart failure.  3.  Left bundle branch block with left axis deviation.  4.  Frequent PACs.  5.  History of brain aneurysm.   PLAN:  Patient will be proceeding with procedure today for implant  cardioverter by V defibrillator by Dr. Berton Mount.      Dorian Pod, NP    ______________________________  Duke Salvia, M.D.    MB/MEDQ  D:  05/05/2005  T:  05/05/2005  Job:   [id number]

## 2010-09-19 NOTE — Assessment & Plan Note (Signed)
[facility]                         ELECTROPHYSIOLOGY OFFICE NOTE   PRADEEP, BEAUBRUN                         MRN:          [id number]  DATE:04/19/2006                            DOB:          Oct 04, 1929    Mr. George Medina is seen in device clinic today, April 19, 2006, for  routine follow up of his St. Jude atlas dual chamber biventricular  defibrillator implanted January of 2007, for nonischemic cardiomyopathy.  Upon interrogation battery voltage is 3.15 volts with last recorded  charge time of 10.6 seconds, there are no episodes stored in the device.  In the atrium, intrinsic amplitude is 2.9 millivolts with an impedance  of 510 ohms and threshold of 0.25 volts at 0.4 milliseconds.  In the  right ventricle, intrinsic amplitude is 12 millivolts with an impedance  of 440 ohms and threshold of 0.5 volts at 0.4 milliseconds and in the  left ventricle intrinsic impedance is 520 ohms with a threshold of 1  volt at 0.4 milliseconds.  Rate response was turned on at this visit, so  the quick optimization was completed with his paced AV changed to 200  milliseconds, sensed AV changed to 160 milliseconds and V:V timing  optimized to 35 milliseconds LV prior to the RV.  George Medina will be  seen in 4 months' time or whenever Dr. Graciela Husbands is next in the [place] office,  and he will begin Merlin transmissions at that time.      George Burke, RN  Electronically Signed      George Salvia, MD, [facility]  Electronically Signed   CF/MedQ  DD: 04/19/2006  DT: 04/19/2006  Job #: [id number]

## 2010-09-22 ENCOUNTER — Encounter: Payer: Self-pay | Admitting: Internal Medicine

## 2010-09-22 ENCOUNTER — Telehealth: Payer: Self-pay | Admitting: *Deleted

## 2010-09-22 NOTE — Telephone Encounter (Signed)
AUTH # [id number] BIV ICD GENERATOR CHANGEOUT 09/23/10 @  [facility]

## 2010-09-22 NOTE — Telephone Encounter (Signed)
Tuesday, May 22 EP [facility] 10:30 Checking percert for ICD generator

## 2010-09-23 ENCOUNTER — Ambulatory Visit (HOSPITAL_COMMUNITY)
Admission: RE | Admit: 2010-09-23 | Discharge: 2010-09-23 | Disposition: A | Discharge status: Home / Self Care | Payer: Medicare Other | Source: Ambulatory Visit | Attending: Internal Medicine | Admitting: Internal Medicine

## 2010-09-23 ENCOUNTER — Ambulatory Visit (HOSPITAL_COMMUNITY): Payer: Medicare Other

## 2010-09-23 DIAGNOSIS — I509 Heart failure, unspecified: Secondary | ICD-10-CM | POA: Insufficient documentation

## 2010-09-23 DIAGNOSIS — I5022 Chronic systolic (congestive) heart failure: Secondary | ICD-10-CM | POA: Insufficient documentation

## 2010-09-23 DIAGNOSIS — I428 Other cardiomyopathies: Secondary | ICD-10-CM | POA: Insufficient documentation

## 2010-09-23 DIAGNOSIS — E119 Type 2 diabetes mellitus without complications: Secondary | ICD-10-CM | POA: Insufficient documentation

## 2010-09-23 DIAGNOSIS — I442 Atrioventricular block, complete: Secondary | ICD-10-CM | POA: Insufficient documentation

## 2010-09-23 DIAGNOSIS — I4891 Unspecified atrial fibrillation: Secondary | ICD-10-CM | POA: Insufficient documentation

## 2010-09-23 DIAGNOSIS — Z4502 Encounter for adjustment and management of automatic implantable cardiac defibrillator: Secondary | ICD-10-CM | POA: Insufficient documentation

## 2010-09-23 LAB — SURGICAL PCR SCREEN
MRSA, PCR: NEGATIVE
Staphylococcus aureus: NEGATIVE

## 2010-09-23 LAB — GLUCOSE, CAPILLARY
Glucose-Capillary: 187 mg/dL — ABNORMAL HIGH (ref 70–99)
Glucose-Capillary: 141 mg/dL — ABNORMAL HIGH (ref 70–99)

## 2010-09-23 LAB — PROTIME-INR: INR: 1.46 (ref 0.00–1.49)

## 2010-09-26 NOTE — Op Note (Signed)
NAMESAFI, CULOTTA                ACCOUNT NO.:  [id number]  MEDICAL RECORD NO.:  [id number]           PATIENT TYPE:  O  LOCATION:  [facility]                         FACILITY:  [facility]  PHYSICIAN:  Hillis Range, MD       DATE OF BIRTH:  04-07-1930  DATE OF PROCEDURE:  09/23/2010 DATE OF DISCHARGE:                              OPERATIVE REPORT   SURGEON:  Hillis Range, MD  PREPROCEDURE DIAGNOSES: 1. Complete heart block. 2. Nonischemic cardiomyopathy. 3. New York Heart Association class 2/3 congestive heart failure     chronically. 4. Paroxysmal atrial fibrillation. 5. Defibrillator at elective replacement indicator battery status.  POSTPROCEDURE DIAGNOSES: 1. Complete heart block. 2. Nonischemic cardiomyopathy. 3. New York Heart Association class 2/3 congestive heart failure     chronically. 4. Paroxysmal atrial fibrillation. 5. Defibrillator at elective replacement indicator battery status.  PROCEDURES:  Defibrillator pulse generator replacement with skin pocket revision.  INTRODUCTION:  Mr. George Medina is a pleasant 77 year old gentleman with a longstanding history of complete heart block, nonischemic cardiomyopathy, and New York Heart Association class 2 congestive heart failure who presents for biventricular ICD pulse generator replacement as he has reached ERI battery status.  He has done well recently since having his device implanted.  He has a Multimedia programmer model 7001 RV lead, but has had no difficulties with this.  As he has now reached ERI battery status, he presents for pulse generator replacement.  DESCRIPTION OF PROCEDURE:  Informed written consent was obtained and the patient was brought to the Electrophysiology Lab in the fasting state. He was adequately sedated with intravenous Versed as outlined in the nursing report.  [name] defibrillator was interrogated and confirmed to be at [facility] battery status.  The lead was then visualized with fluoroscopy in  the EP Lab in both the AP projection as well as an LAO projection specifically looking at the [facility] Jude Medical Riata model 7001 right ventricular lead.  There was no evidence of extrusion or insulation breech within this lead.  The patient was then prepped and draped in the usual sterile fashion by the EP Lab staff.  The skin overlying his existing defibrillator was infiltrated with lidocaine for local analgesia.  A 4-cm incision was made over the existing defibrillator pocket.  The pocket was then opened using a combination of sharp and blunt dissection..  Electrocautery was required to assure hemostasis.  The existing defibrillator was identified and carefully removed from the pocket.  The device was disconnected from the leads. There was no foreign matter or debris within the pocket.  The atrial lead was confirmed to be a Medtronic model [id number] (serial number [id number]) lead implanted on May 05, 2005.  The right ventricular lead was confirmed to be a Designer, jewellery Riata ST model [id number] (serial number [id number]) lead implanted on May 05, 2005.  The left ventricular lead was confirmed to be a Medtronic model [id number] (serial number [id number]) lead.  All three leads were examined and their integrities were confirmed to be intact.  Atrial lead P-waves measured 3.8 mV with an impedance of 466 ohms and a  threshold of 0.3 volts at 0.5 milliseconds.  The right ventricular defibrillator lead R-waves measured 7 mV with impedance of 508 ohms and a threshold of 0.5 volts at 0.5 milliseconds.  The left ventricular coronary sinus lead R-waves measured 18 mV with impedance of 556 ohms and a threshold of 1.1 volts at 0.5 milliseconds.  All three leads were therefore connected to a [place]. Jude Medical Unify model [phone] (serial number [id number]) biventricular ICD. As the patient has complete heart block and there have recently been reports of insulation breeches with the Riata leads, I thought  that it was most prudent at this time to place the left ventricular Medtronic lead and in the right ventricular port on the defibrillator can and then placed the St. Jude Medical Riata pace sense lead portion within the left ventricular portion of the header of the device.  Once all three leads were connected to the device, adequate sensing and pacing was demonstrated through the device.  The pocket was then revised to accommodate this new device.  The pocket was irrigated with copious gentamicin solution.  The defibrillator was then placed into the pocket. The pocket was then closed in two layers with 2.0 Vicryl suture over the subcutaneous and subcuticular layers.  Steri-Strips and a sterile dressing were then applied.  There were no early apparent complications. Given the patient's advanced age, I elected to not perform defibrillation threshold testing today.  CONCLUSIONS: 1. Successful Unify biventricular ICD pulse generator replacement for     ERI battery status. 2. The right ventricular Riata lead appears to be functioning normally     both fluoroscopically and electrically.  This lead was paced into     the left ventricular port of the Unify ICD and the Medtronic model     [id number].  Left ventricular coronary sinus lead was placed into the     right ventricular pace sense port of the defibrillator. 3. DFT testing not performed today. 4. No early apparent complications.     Hillis Range, MD     JA/MEDQ  D:  09/23/2010  T:  09/24/2010  Job:  [id number]  cc:   Maryfrances Bunnell, MD  Electronically Signed by Hillis Range MD on 09/26/2010 05:55:34 PM

## 2010-10-01 ENCOUNTER — Ambulatory Visit (INDEPENDENT_AMBULATORY_CARE_PROVIDER_SITE_OTHER): Payer: Medicare Other | Admitting: *Deleted

## 2010-10-01 DIAGNOSIS — I428 Other cardiomyopathies: Secondary | ICD-10-CM

## 2010-10-01 DIAGNOSIS — Z95810 Presence of automatic (implantable) cardiac defibrillator: Secondary | ICD-10-CM

## 2010-10-01 DIAGNOSIS — I442 Atrioventricular block, complete: Secondary | ICD-10-CM

## 2010-10-01 DIAGNOSIS — I5022 Chronic systolic (congestive) heart failure: Secondary | ICD-10-CM

## 2010-10-01 NOTE — Progress Notes (Signed)
Wound check-icd  

## 2010-11-13 ENCOUNTER — Inpatient Hospital Stay (HOSPITAL_COMMUNITY)
Admission: RE | Admit: 2010-11-13 | Discharge: 2010-11-14 | DRG: 578 | Disposition: A | Discharge status: Home Health | Payer: Medicare Other | Source: Ambulatory Visit | Attending: Plastic Surgery | Admitting: Plastic Surgery

## 2010-11-13 ENCOUNTER — Other Ambulatory Visit: Payer: Self-pay | Admitting: Plastic Surgery

## 2010-11-13 DIAGNOSIS — I10 Essential (primary) hypertension: Secondary | ICD-10-CM | POA: Diagnosis present

## 2010-11-13 DIAGNOSIS — C4441 Basal cell carcinoma of skin of scalp and neck: Principal | ICD-10-CM | POA: Diagnosis present

## 2010-11-13 DIAGNOSIS — I2589 Other forms of chronic ischemic heart disease: Secondary | ICD-10-CM | POA: Diagnosis present

## 2010-11-13 DIAGNOSIS — E119 Type 2 diabetes mellitus without complications: Secondary | ICD-10-CM | POA: Diagnosis present

## 2010-11-13 DIAGNOSIS — I4891 Unspecified atrial fibrillation: Secondary | ICD-10-CM | POA: Diagnosis present

## 2010-11-13 DIAGNOSIS — Z87891 Personal history of nicotine dependence: Secondary | ICD-10-CM

## 2010-11-13 DIAGNOSIS — Z880 Allergy status to penicillin: Secondary | ICD-10-CM

## 2010-11-13 DIAGNOSIS — C44211 Basal cell carcinoma of skin of unspecified ear and external auricular canal: Secondary | ICD-10-CM | POA: Diagnosis present

## 2010-11-13 DIAGNOSIS — J449 Chronic obstructive pulmonary disease, unspecified: Secondary | ICD-10-CM | POA: Diagnosis present

## 2010-11-13 DIAGNOSIS — Z95810 Presence of automatic (implantable) cardiac defibrillator: Secondary | ICD-10-CM

## 2010-11-13 DIAGNOSIS — J4489 Other specified chronic obstructive pulmonary disease: Secondary | ICD-10-CM | POA: Diagnosis present

## 2010-11-13 LAB — CBC
HCT: 34.1 % — ABNORMAL LOW (ref 39.0–52.0)
Hemoglobin: 10.7 g/dL — ABNORMAL LOW (ref 13.0–17.0)
MCH: 24.7 pg — ABNORMAL LOW (ref 26.0–34.0)
MCHC: 31.4 g/dL (ref 30.0–36.0)
MCV: 78.8 fL (ref 78.0–100.0)
Platelets: 195 10*3/uL (ref 150–400)
RDW: 15.7 % — ABNORMAL HIGH (ref 11.5–15.5)

## 2010-11-13 LAB — BASIC METABOLIC PANEL
Calcium: 8.7 mg/dL (ref 8.4–10.5)
Chloride: 102 mEq/L (ref 96–112)
Glucose, Bld: 191 mg/dL — ABNORMAL HIGH (ref 70–99)

## 2010-11-13 LAB — PROTIME-INR: Prothrombin Time: 14.5 seconds (ref 11.6–15.2)

## 2010-11-13 LAB — GLUCOSE, CAPILLARY
Glucose-Capillary: 212 mg/dL — ABNORMAL HIGH (ref 70–99)
Glucose-Capillary: 231 mg/dL — ABNORMAL HIGH (ref 70–99)

## 2010-11-13 LAB — APTT: aPTT: 34 seconds (ref 24–37)

## 2010-11-14 ENCOUNTER — Inpatient Hospital Stay (HOSPITAL_COMMUNITY): Payer: Medicare Other

## 2010-11-14 LAB — HEMOGLOBIN A1C
Hgb A1c MFr Bld: 7.5 % — ABNORMAL HIGH (ref <5.7)
Mean Plasma Glucose: 169 mg/dL — ABNORMAL HIGH (ref <117)

## 2010-11-14 LAB — BASIC METABOLIC PANEL
BUN: 17 mg/dL (ref 6–23)
Chloride: 99 mEq/L (ref 96–112)
GFR calc Af Amer: >60 mL/min (ref >60)
Glucose, Bld: 201 mg/dL — ABNORMAL HIGH (ref 70–99)
Potassium: 4.3 mEq/L (ref 3.5–5.1)
Sodium: 133 mEq/L — ABNORMAL LOW (ref 135–145)

## 2010-11-14 LAB — CBC
HCT: 31.1 % — ABNORMAL LOW (ref 39.0–52.0)
Hemoglobin: 9.8 g/dL — ABNORMAL LOW (ref 13.0–17.0)
RDW: 15.6 % — ABNORMAL HIGH (ref 11.5–15.5)
WBC: 9.6 10*3/uL (ref 4.0–10.5)

## 2010-11-14 LAB — GLUCOSE, CAPILLARY

## 2010-11-19 NOTE — Consult Note (Signed)
NAMEROCKET, GUNDERSON NO.:  [id number]  MEDICAL RECORD NO.:  [id number]  LOCATION:  [facility]SC                         FACILITY:  [facility]  PHYSICIAN:  Thad Ranger, MD       DATE OF BIRTH:  08/15/1929  DATE OF CONSULTATION: DATE OF DISCHARGE:                                CONSULTATION   PRIMARY ATTENDING:  Wayland Denis, DO  REASON FOR CONSULTATION:  Medical comanagement.  BRIEF HISTORY OF PRESENT ILLNESS:  Mr. Hyser is a 78 year old male with medical history of diabetes mellitus, hypertension, paroxysmal atrial fibrillation on anticoagulation, and nonischemic cardiomyopathy with complete heart block with biventricular ICD is currently seen in PICU after his surgery for the basal cell carcinoma.  Medicine consulted for medical comanagement.  At present on examination, the patient complains of pain postop.  Otherwise, no medical complaints of any chest pain, shortness of breath, nausea, vomiting, or any abdominal pain or palpitations.  PAST MEDICAL HISTORY: 1. History of diabetes mellitus on oral hypoglycemics. 2. History of complete heart block with known ischemic cardiomyopathy     with biventricular ICD. 3. Paroxysmal atrial fibrillation. 4. History of class II CHF. 5. History of cerebral aneurysm.  ALLERGIES:  The patient is allergic to penicillin and Ancef.  SOCIAL HISTORY:  The patient quit smoking in 1994, previously smoked 20+ pack year.  Denies any alcohol or any drug use.  He currently lives at home.  MEDICATIONS:  Prior to admission, 1. Visine eyedrops daily as needed. 2. Albuterol inhaler 2 puffs every 4 hours as needed. 3. Vytorin 10/40 mg 1 tablet with p.o. daily. 4. Doxazosin 4 mg p.o. daily. 5. Metformin 1000 mg q.a.m. 6. Januvia 100 mg p.o. daily. 7. Glipizide 5 mg p.o. daily. 8. Coreg 25 mg p.o. b.i.d. 9. Altace 10 mg p.o. daily.  PHYSICAL EXAMINATION:  VITAL SIGNS:  Pulse 60, O2 sats 95%, respiratory rate 12, and blood pressure  140/67. GENERAL:  The patient is alert, awake, and oriented not in any acute distress. HEENT:  Anicteric sclerae.  Pink conjunctivae. CVS:  S1. S2 clear. CHEST:  Mild scattered rhonchi. ABDOMEN:  Soft, nontender, nondistended.  Normal bowel sounds. EXTREMITIES:  No cyanosis, clubbing, or edema noted in upper and lower extremities bilaterally.  LABORATORY DATA:  CBC at the time of admission today WBC 7.7, hemoglobin 10.7, hematocrit 34.1, and platelets 195.  BMET; sodium 138, potassium 4.7, BUN 21, creatinine 0.98, calcium 8.7.  PCR, MRSA, and staph aureus is negative.  IMPRESSION:  Mr. Fallert is a 77 year old male with medical history including diabetes, hypertension, and paroxysmal atrial fibrillation with complete heart block/nonischemic cardiomyopathy with biventricular ICD, possibly some COPD component from remote nicotine abuse, seen in PICU today after the basal cell carcinoma surgery.  RECOMMENDATIONS: 1. Recommended observation on the tele floor given his strong cardiac     history.  This was discussed by myself with PICU nurse. 2. Hypertension, currently stable.  Continue the patient's p.o.     antihypertensives, which include Altace and Coreg with parameters. 3. Atrial fibrillation, currently rate controlled.  Continue beta-     blocker.  I recommend to hold Coumadin for now given he is postop. 4.  Diabetes mellitus.  Obtain HbA1c and we will also place him on a     sliding scale insulin.  I have changed his diet to carb-modified     diabetic diet.  I recommend to hold off on oral hypoglycemics until     the patient is eating and tolerating diet. 5. History of complete heart block with biventricular ICD.  ICD was     interrogated today.  If the patient has any cardiac issues, [facility] can be contacted.  [name] cardiologist is Dr.     Johney Frame. 6. Prophylaxis.  Bilateral SCDs or TED hoses for DVT prophylaxis.  Thank you for the consult.  We will follow  the patient with you.     Thad Ranger, MD     RR/MEDQ  D:  11/13/2010  T:  11/13/2010  Job:  [id number]  cc:   Hillis Range, MD Wayland Denis, DO  Electronically Signed by Andres Labrum Jaydyn Bozzo  on 11/19/2010 05:57:19 PM

## 2010-11-25 NOTE — Op Note (Signed)
NAMET[place], PERRA NO.:  [id number]  MEDICAL RECORD NO.:  [id number]  LOCATION:  3733                         FACILITY:  [facility]  PHYSICIAN:  Wayland Denis, DO      DATE OF BIRTH:  Feb 14, 1930  DATE OF PROCEDURE:  11/13/2010 DATE OF DISCHARGE:                              OPERATIVE REPORT   PREOPERATIVE DIAGNOSIS:  Left ear and scalp basal cell carcinoma with defect.  POSTOPERATIVE DIAGNOSIS:  Left ear and scalp basal cell carcinoma with defect.  PROCEDURES: 1. Excision of fascia for pathology, tissue rearrangement, full-     thickness skin graft and split-thickness skin graft, full-thickness     skin graft 3 x 4 cm, split-thickness skin graft 4 x 10 cm. 2. Scalp and posterior ear tissue rearrangement to scalp and     postauricular area, primary closure ear defect.  SURGEON:  Wayland Denis, DO  ANESTHESIA:  General.  INDICATIONS FOR PROCEDURE:  The patient is a 77 year old white male who underwent excision of basal cell carcinoma on the posterior left ear and scalp area with a positive margin over the mastoid.  All the soft tissue had negative margin.  The patient was seen in the preoperative holding area.  Risks and complications were explained in detail.  DESCRIPTION OF PROCEDURE:  The patient was seen in the holding room, was taken to the operating room, placed on the operating room table in supine position.  General anesthesia was administered.  Once adequate, he was placed in a semi right lateral position.  He was prepped and draped in the usual sterile fashion.  A time-out was called, all information was confirmed to be correct by those in the room.  He was prepped with Betadine and draped in usual sterile fashion.  His left leg was prepped for skin graft.  The dermatologist had marked the positive margin with paint which was used to guide the excision.  That area which was over the mastoid was excised to a thin plate of bone.  A bur was then  used to bur down the several thin layers on the bone.  Irrigation was used to irrigate the area.  A scar was noted at the cervical area anterior to the lobe of the ear.  There is no history of parotiditis. This was likely due to previous excision.  The incision was opened approximately 8 cm inferiorly and the graft was undermined and advanced into the defect.  It was initially packed with 3-0 and 4-0 Monocryl.  In deep layer, a 5-0 Vicryl was used and then a 5-0 Monocryl to reapproximate the skin edges.  Hemostasis was achieved with electrocautery.  This came up to cover a portion of the exposed bone. The superior portion was then undermined at the cutaneous level superiorly and posteriorly to create a flap.  This was then cut approximately 10 cm and rotated into the defect to cover it completely. It was tacked into place with 3-0 and 4-0 Monocryl simple interrupted and horizontal mattress sutures.  A 5-0 Monocryl was then used to reapproximate the skin edges.  Prior to the final closure, a 7 Blake drain was placed and secured to the skin  with a 3-0 silk.  There was a defect that came from the rotated superior portion.  It was thought best to use a split-thickness skin graft on this, therefore the dermatome was set 04/999 and then skin graft was obtained.  Fibrin glue was used to help tack the graft to secure the graft in place, 5-0 Vicryl was additionally used and 4-0 silk to secure the Xeroform bolster that contained cotton on the inside to help secure the graft in place.  The dermotome was then set at 0/1000 of an inch, essentially a full- thickness skin graft to the depth of texture, and this was sutured to the posterior ear defect with 5-0 Vicryl and 4-0 silk to secure the bolster.  There was a full-thickness defect in the concha.  This was sutured with 5-0 Monocryl for primary closure.  Triple antibiotic was placed at the suture line with a Xeroform wrap.  The donor site  was dressed with OpSite.  The patient was awoken and taken to recovery room in stable condition.  He tolerated the procedure well.  There were no complications.  Specimens sent to Pathology.     Wayland Denis, DO     CS/MEDQ  D:  11/13/2010  T:  11/14/2010  Job:  [id number]  Electronically Signed by Wayland Denis  on 11/25/2010 08:16:39 AM

## 2010-11-26 NOTE — Discharge Summary (Signed)
  NAMECONRADO, NANCE NO.:  [id number]  MEDICAL RECORD NO.:  [id number]  LOCATION:  3733                         FACILITY:  [facility]  PHYSICIAN:  Wayland Denis, DO      DATE OF BIRTH:  [birth year]  DATE OF ADMISSION:  11/13/2010 DATE OF DISCHARGE:  11/14/2010                              DISCHARGE SUMMARY   ADMITTING DIAGNOSIS:  Skin cancer left scalp postauricular area.  DESCRIPTION OF HOSPITAL COURSE:  The patient was taken to the operating room where he underwent tissue rearrangement and advancement with split thickness and full thickness skin graft to the ear and scalp defect.  A repeat biopsy was taken over the mastoid in order to obtain clear margins.  A drain was placed in the OR.  He tolerated the procedure well.  He was managed in the Step-Down Unit postoperatively.  His medical needs were managed by the hospitalist service including blood pressure control and blood sugar control.    He required insulin sliding scale and management of his afib and CAD by the hospitalist service.  O2 was required intermittently but at the time of discharge he was able to ambulate with out O2 required. He did well throughout the hospital stay and was discharged to home with home health for wound check and drain care. The operative site was dry and  the drain had minimal output.  The incision was intact.  He will follow up in my office in one week.   Discharge Medications: Levaquin  Vicodin     Wayland Denis, DO     CS/MEDQ  D:  11/25/2010  T:  11/26/2010  Job:  [id number]  Electronically Signed by Wayland Denis  on 11/26/2010 09:06:26 AM

## 2011-01-06 ENCOUNTER — Encounter: Payer: Self-pay | Admitting: Internal Medicine

## 2011-01-08 ENCOUNTER — Ambulatory Visit (INDEPENDENT_AMBULATORY_CARE_PROVIDER_SITE_OTHER): Payer: Medicare Other | Admitting: Internal Medicine

## 2011-01-08 ENCOUNTER — Encounter: Payer: Self-pay | Admitting: Internal Medicine

## 2011-01-08 DIAGNOSIS — I5022 Chronic systolic (congestive) heart failure: Secondary | ICD-10-CM

## 2011-01-08 DIAGNOSIS — I442 Atrioventricular block, complete: Secondary | ICD-10-CM

## 2011-01-08 DIAGNOSIS — Z95810 Presence of automatic (implantable) cardiac defibrillator: Secondary | ICD-10-CM

## 2011-01-08 DIAGNOSIS — I4891 Unspecified atrial fibrillation: Secondary | ICD-10-CM

## 2011-01-08 DIAGNOSIS — I428 Other cardiomyopathies: Secondary | ICD-10-CM

## 2011-01-08 LAB — ICD DEVICE OBSERVATION
AL IMPEDENCE ICD: 487.5 Ohm
ATRIAL PACING ICD: 2.2 pct
BAMS-0001: 180 {beats}/min
BAMS-0003: 90 {beats}/min
CHARGE TIME: 8.1 s
DEVICE MODEL ICD: 643216
LV LEAD IMPEDENCE ICD: 425 Ohm
LV LEAD THRESHOLD: 0.5 V
MODE SWITCH EPISODES: 21
TOT-0006: 20120522000000
TOT-0007: 0
TOT-0008: 0
TZAT-0004SLOWVT: 8
TZAT-0012SLOWVT: 200 ms
TZAT-0013SLOWVT: 2
TZAT-0019SLOWVT: 7.5 V
TZAT-0020SLOWVT: 1 ms
TZON-0003SLOWVT: 340 ms
TZON-0004SLOWVT: 30
TZST-0001SLOWVT: 3
TZST-0001SLOWVT: 4
TZST-0003SLOWVT: 36 J
TZST-0003SLOWVT: 40 J
VENTRICULAR PACING ICD: 99 pct
VF: 0

## 2011-01-08 NOTE — Progress Notes (Signed)
The patient presents today for routine electrophysiology followup.  Since last being seen in our clinic, the patient is s/p ICD generator change.  He has done very well since that time.  His SOB is stable.  Today, he denies symptoms of palpitations, chest pain, orthopnea, PND, lower extremity edema, dizziness, presyncope, syncope, or neurologic sequela.  The patient feels that he is tolerating medications without difficulties and is otherwise without complaint today.   Past Medical History  Diagnosis Date  . Nonischemic cardiomyopathy   . Nonsustained ventricular tachycardia   . CHF (congestive heart failure)     chronic-systolic now class I-II  . A-fib     anticoagulated with coumadin  . Complete heart block   . Hypertension   . Diabetes mellitus   . Brain aneurysm 1994    required surgery x2   Past Surgical History  Procedure Date  . Cardiac defibrillator placement     most recent gen change 5/12,  he has a SJM 7001 lead which was fluroscopically normal during generator change 5/12.  LV and RV leads were switched in the header  . Cerebral aneurysm repair     x2    Current Outpatient Prescriptions  Medication Sig Dispense Refill  . albuterol (PROVENTIL HFA;VENTOLIN HFA) 108 (90 BASE) MCG/ACT inhaler Inhale 2 puffs into the lungs every 6 (six) hours as needed.        Marland Kitchen albuterol (PROVENTIL) (2.5 MG/3ML) 0.083% nebulizer solution Take 3 mLs by nebulization Once daily as needed.      . carvedilol (COREG) 25 MG tablet Take 25 mg by mouth 2 (two) times daily with a meal.        . cholecalciferol (VITAMIN D) 1000 UNITS tablet Take 1,000 Units by mouth daily.        Marland Kitchen glipiZIDE (GLUCOTROL XL) 2.5 MG 24 hr tablet Take 2.5 mg by mouth daily.        . metFORMIN (GLUCOPHAGE) 1000 MG tablet Take 1,000 mg by mouth 2 (two) times daily with a meal.       . ramipril (ALTACE) 10 MG capsule Take 10 mg by mouth daily.        . sitaGLIPtan (JANUVIA) 100 MG tablet Take 100 mg by mouth daily.        Marland Kitchen  VYTORIN 10-40 MG per tablet Take 0.5 tablets by mouth Daily.      Marland Kitchen warfarin (COUMADIN) 5 MG tablet Take 5 mg by mouth daily. Take 1 tablet Monday to Friday not on Saturday.        Allergies  Allergen Reactions  . Cefazolin   . Penicillins     History   Social History  . Marital Status: Widowed    Spouse Name: N/A    Number of Children: N/A  . Years of Education: N/A   Occupational History  . RETIRED    Social History Main Topics  . Smoking status: Former Smoker -- 2.0 packs/day for 30 years    Types: Cigarettes    Quit date: 05/05/1991  . Smokeless tobacco: Never Used  . Alcohol Use: No  . Drug Use: No  . Sexually Active: Not on file   Other Topics Concern  . Not on file   Social History Narrative  . No narrative on file    Family History  Problem Relation Age of Onset  . Diabetes Sister   . Heart disease Brother   . Hypertension Brother   . Coronary artery disease Other  Physical Exam: Filed Vitals:   01/08/11 0834  BP: 125/69  Pulse: 71  Height: 5\' 9"  (1.753 m)  Weight: 173 lb (78.472 kg)  SpO2: 92%    GEN- The patient is well appearing, alert and oriented x 3 today.   Head- normocephalic, atraumatic Eyes-  Sclera clear, conjunctiva pink Ears- hearing intact Oropharynx- clear Neck- supple, no JVP Lymph- no cervical lymphadenopathy Lungs- coarse BS clear with cough, normal work of breathing Chest- ICD pocket is well healed Heart- Regular rate and rhythm, no murmurs, rubs or gallops, PMI not laterally displaced GI- soft, NT, ND, + BS Extremities- no clubbing, cyanosis, or edema MS- no significant deformity or atrophy Skin- no rash or lesion Psych- euthymic mood, full affect Neuro- strength and sensation are intact  ICD interrogation- reviewed in detail today,  See PACEART report  Assessment and Plan:

## 2011-01-08 NOTE — Assessment & Plan Note (Signed)
Doing well As above

## 2011-01-08 NOTE — Assessment & Plan Note (Signed)
Maintaining sinus rhythm Continue coumadin 

## 2011-01-08 NOTE — Assessment & Plan Note (Signed)
Normal ICD function See [facility]rita Miss Art report No changes today  His 7001 lead is normal both fluoroscopically and electrically.  I empirically switched the leads in the RV/LV header at time of generator change 5/12 due to reports of 7001 lead failure in the literature.

## 2011-04-09 ENCOUNTER — Ambulatory Visit (INDEPENDENT_AMBULATORY_CARE_PROVIDER_SITE_OTHER): Payer: Medicare Other | Admitting: *Deleted

## 2011-04-09 ENCOUNTER — Encounter: Payer: Self-pay | Admitting: Internal Medicine

## 2011-04-09 ENCOUNTER — Encounter (INDEPENDENT_AMBULATORY_CARE_PROVIDER_SITE_OTHER): Payer: Medicare Other | Admitting: *Deleted

## 2011-04-09 ENCOUNTER — Other Ambulatory Visit: Payer: Self-pay | Admitting: Internal Medicine

## 2011-04-09 DIAGNOSIS — Z95810 Presence of automatic (implantable) cardiac defibrillator: Secondary | ICD-10-CM

## 2011-04-09 DIAGNOSIS — I472 Ventricular tachycardia: Secondary | ICD-10-CM

## 2011-04-09 DIAGNOSIS — I5022 Chronic systolic (congestive) heart failure: Secondary | ICD-10-CM

## 2011-04-09 DIAGNOSIS — I429 Cardiomyopathy, unspecified: Secondary | ICD-10-CM

## 2011-04-16 ENCOUNTER — Encounter: Payer: Self-pay | Admitting: *Deleted

## 2011-04-27 LAB — REMOTE ICD DEVICE
AL AMPLITUDE: 2.6 mv
ATRIAL PACING ICD: 5.6 pct
BAMS-0003: 90 {beats}/min
HV IMPEDENCE: 51 Ohm
RV LEAD IMPEDENCE ICD: 610 Ohm
TZAT-0001SLOWVT: 1
TZAT-0020SLOWVT: 1 ms
TZON-0004SLOWVT: 30
TZON-0005SLOWVT: 6
TZST-0001SLOWVT: 2
TZST-0001SLOWVT: 4
TZST-0003SLOWVT: 36 J
VENTRICULAR PACING ICD: 100 pct

## 2011-04-29 NOTE — Progress Notes (Signed)
ICD remote 

## 2011-04-30 ENCOUNTER — Encounter: Payer: Self-pay | Admitting: *Deleted

## 2011-07-30 ENCOUNTER — Encounter: Payer: Self-pay | Admitting: Internal Medicine

## 2011-07-30 ENCOUNTER — Ambulatory Visit (INDEPENDENT_AMBULATORY_CARE_PROVIDER_SITE_OTHER): Payer: Medicare Other | Admitting: *Deleted

## 2011-07-30 DIAGNOSIS — I442 Atrioventricular block, complete: Secondary | ICD-10-CM

## 2011-07-30 DIAGNOSIS — I4891 Unspecified atrial fibrillation: Secondary | ICD-10-CM

## 2011-07-30 DIAGNOSIS — Z95810 Presence of automatic (implantable) cardiac defibrillator: Secondary | ICD-10-CM

## 2011-08-03 LAB — REMOTE ICD DEVICE
AL IMPEDENCE ICD: 480 Ohm
BRDY-0004RA: 120 {beats}/min
DEVICE MODEL ICD: 643216
HV IMPEDENCE: 47 Ohm
LV LEAD IMPEDENCE ICD: 440 Ohm
TZAT-0001SLOWVT: 1
TZAT-0004SLOWVT: 8
TZAT-0019SLOWVT: 7.5 V
TZAT-0020SLOWVT: 1 ms
TZON-0003SLOWVT: 340 ms
TZON-0004SLOWVT: 30
TZST-0001SLOWVT: 4
TZST-0003SLOWVT: 36 J

## 2011-08-05 NOTE — Progress Notes (Signed)
Remote icd check  

## 2011-08-20 ENCOUNTER — Encounter: Payer: Self-pay | Admitting: *Deleted

## 2011-11-06 ENCOUNTER — Ambulatory Visit (INDEPENDENT_AMBULATORY_CARE_PROVIDER_SITE_OTHER): Payer: Medicare Other | Admitting: *Deleted

## 2011-11-06 ENCOUNTER — Encounter: Payer: Self-pay | Admitting: Internal Medicine

## 2011-11-06 DIAGNOSIS — I442 Atrioventricular block, complete: Secondary | ICD-10-CM

## 2011-11-06 DIAGNOSIS — Z95810 Presence of automatic (implantable) cardiac defibrillator: Secondary | ICD-10-CM

## 2011-11-06 DIAGNOSIS — I5022 Chronic systolic (congestive) heart failure: Secondary | ICD-10-CM

## 2011-11-09 LAB — REMOTE ICD DEVICE
AL IMPEDENCE ICD: 640 Ohm
BAMS-0003: 90 {beats}/min
BRDY-0002RA: 60 {beats}/min
BRDY-0003RA: 120 {beats}/min
LV LEAD IMPEDENCE ICD: 430 Ohm
RV LEAD IMPEDENCE ICD: 650 Ohm
TZAT-0013SLOWVT: 2
TZAT-0020SLOWVT: 1 ms
TZON-0004SLOWVT: 30
TZON-0005SLOWVT: 6
TZST-0001SLOWVT: 2
TZST-0001SLOWVT: 4
TZST-0003SLOWVT: 40 J

## 2011-11-30 ENCOUNTER — Encounter: Payer: Self-pay | Admitting: *Deleted

## 2012-01-08 ENCOUNTER — Encounter: Payer: Self-pay | Admitting: Internal Medicine

## 2012-01-08 ENCOUNTER — Ambulatory Visit (INDEPENDENT_AMBULATORY_CARE_PROVIDER_SITE_OTHER): Payer: Medicare Other | Admitting: Internal Medicine

## 2012-01-08 VITALS — BP 138/70 | HR 64 | Ht 69.0 in | Wt 176.0 lb

## 2012-01-08 DIAGNOSIS — I4891 Unspecified atrial fibrillation: Secondary | ICD-10-CM

## 2012-01-08 DIAGNOSIS — I442 Atrioventricular block, complete: Secondary | ICD-10-CM

## 2012-01-08 DIAGNOSIS — I4729 Other ventricular tachycardia: Secondary | ICD-10-CM

## 2012-01-08 DIAGNOSIS — I472 Ventricular tachycardia: Secondary | ICD-10-CM

## 2012-01-08 DIAGNOSIS — I10 Essential (primary) hypertension: Secondary | ICD-10-CM

## 2012-01-08 DIAGNOSIS — I5022 Chronic systolic (congestive) heart failure: Secondary | ICD-10-CM

## 2012-01-08 LAB — ICD DEVICE OBSERVATION
AL IMPEDENCE ICD: 480 Ohm
BAMS-0001: 180 {beats}/min
BAMS-0003: 90 {beats}/min
LV LEAD IMPEDENCE ICD: 450 Ohm
RV LEAD THRESHOLD: 0.5 V
RV LEAD THRESHOLD: 0.75 V
TZAT-0004SLOWVT: 8
TZAT-0013SLOWVT: 2
TZAT-0018SLOWVT: NEGATIVE
TZST-0001SLOWVT: 3
TZST-0003SLOWVT: 40 J

## 2012-01-08 NOTE — Assessment & Plan Note (Signed)
euvolemic today He will ask his primary care physician to forward BMET and other lab results to my office No changes today

## 2012-01-08 NOTE — Patient Instructions (Addendum)
Remote monitoring is used to monitor your Pacemaker of ICD from home. This monitoring reduces the number of office visits required to check your device to one time per year. It allows [place] to keep an eye on the functioning of your device to ensure it is working properly. You are scheduled for a device check from home on April 11, 2012. You may send your transmission at any time that day. If you have a wireless device, the transmission will be sent automatically. After your physician reviews your transmission, you will receive a postcard with your next transmission date.  Your physician wants you to follow-up in: 1 year with Dr Johney Frame.  You will receive a reminder letter in the mail two months in advance. If you don't receive a letter, please call our office to schedule the follow-up appointment.  Your physician recommends that you continue on your current medications as directed. Please refer to the Current Medication list given to you today.

## 2012-01-08 NOTE — Assessment & Plan Note (Signed)
Stable No change required today  

## 2012-01-08 NOTE — Progress Notes (Signed)
PCP: Eldridge Abrahams, MD  George Medina is a 78 y.o. male who presents today for routine electrophysiology followup.  Since last being seen in our clinic, the patient reports doing very well.  He bowls and plays golf without limitation. Today, he denies symptoms of palpitations, chest pain, shortness of breath,  lower extremity edema, dizziness, presyncope, syncope, or ICD shocks.  The patient is otherwise without complaint today.   Past Medical History  Diagnosis Date  . Nonischemic cardiomyopathy   . Nonsustained ventricular tachycardia   . CHF (congestive heart failure)     chronic-systolic now class I-II  . A-fib     anticoagulated with coumadin  . Complete heart block   . Hypertension   . Diabetes mellitus   . Brain aneurysm 1994    required surgery x2   Past Surgical History  Procedure Date  . Cardiac defibrillator placement     most recent gen change 5/12,  he has a SJM 7001 lead which was fluroscopically normal during generator change 5/12.  LV and RV leads were switched in the header  . Cerebral aneurysm repair     x2    Current Outpatient Prescriptions  Medication Sig Dispense Refill  . albuterol (PROVENTIL HFA;VENTOLIN HFA) 108 (90 BASE) MCG/ACT inhaler Inhale 2 puffs into the lungs every 6 (six) hours as needed.        Marland Kitchen albuterol (PROVENTIL) (2.5 MG/3ML) 0.083% nebulizer solution Take 3 mLs by nebulization Once daily as needed.      . carvedilol (COREG) 25 MG tablet Take 25 mg by mouth 2 (two) times daily with a meal.        . cholecalciferol (VITAMIN D) 1000 UNITS tablet Take 1,000 Units by mouth daily.        . fluticasone (FLONASE) 50 MCG/ACT nasal spray Place 1 spray into the nose as needed.       . furosemide (LASIX) 20 MG tablet Take 20 mg by mouth as needed.       Marland Kitchen glipiZIDE (GLUCOTROL XL) 2.5 MG 24 hr tablet Take 2.5 mg by mouth daily.        . metFORMIN (GLUCOPHAGE) 1000 MG tablet Take 1,000 mg by mouth 2 (two) times daily with a meal.       . ramipril  (ALTACE) 10 MG capsule Take 10 mg by mouth daily.        Marland Kitchen VYTORIN 10-40 MG per tablet Take 0.5 tablets by mouth Daily.      Marland Kitchen warfarin (COUMADIN) 5 MG tablet Take 5 mg by mouth daily. Take 1 tablet Monday to Friday not on Saturday.        Physical Exam: Filed Vitals:   01/08/12 1028  BP: 138/70  Pulse: 64  Height: 5\' 9"  (1.753 m)  Weight: 176 lb (79.833 kg)    GEN- The patient is well appearing, alert and oriented x 3 today.   Head- normocephalic, atraumatic Eyes-  Sclera clear, conjunctiva pink Ears- hearing intact Oropharynx- clear Lungs- Normal work of breathing Chest- ICD pocket is well healed Heart- Regular rate and rhythm GI- soft, NT, ND,   Extremities- no clubbing, cyanosis, or edema  ICD interrogation- reviewed in detail today,  See PACEART report  Assessment and Plan:

## 2012-01-08 NOTE — Assessment & Plan Note (Signed)
Adequately anticoagulated with coumadin, Very low afib burden

## 2012-01-08 NOTE — Assessment & Plan Note (Signed)
No arrhythmias since last interrogation No changes Normal ICD function

## 2012-01-08 NOTE — Assessment & Plan Note (Signed)
Normal BiV ICD function See Pace Art report No changes today  

## 2012-04-11 ENCOUNTER — Encounter: Payer: Self-pay | Admitting: Internal Medicine

## 2012-04-11 ENCOUNTER — Encounter: Payer: Self-pay | Admitting: *Deleted

## 2012-04-11 ENCOUNTER — Ambulatory Visit (INDEPENDENT_AMBULATORY_CARE_PROVIDER_SITE_OTHER): Payer: Medicare Other | Admitting: *Deleted

## 2012-04-11 DIAGNOSIS — I472 Ventricular tachycardia: Secondary | ICD-10-CM

## 2012-04-11 DIAGNOSIS — I4729 Other ventricular tachycardia: Secondary | ICD-10-CM

## 2012-04-11 DIAGNOSIS — Z95810 Presence of automatic (implantable) cardiac defibrillator: Secondary | ICD-10-CM

## 2012-04-11 LAB — REMOTE ICD DEVICE
AL AMPLITUDE: 2.6 mv
ATRIAL PACING ICD: 18 pct
BAMS-0001: 180 {beats}/min
BAMS-0003: 90 {beats}/min
BRDY-0003RA: 120 {beats}/min
DEVICE MODEL ICD: 643216
RV LEAD IMPEDENCE ICD: 600 Ohm
TZAT-0004SLOWVT: 8
TZAT-0012SLOWVT: 200 ms
TZAT-0013SLOWVT: 2
TZAT-0018SLOWVT: NEGATIVE
TZON-0005SLOWVT: 6
TZST-0001SLOWVT: 3
TZST-0003SLOWVT: 40 J
VENTRICULAR PACING ICD: 100 pct

## 2012-04-13 ENCOUNTER — Encounter: Payer: Self-pay | Admitting: *Deleted

## 2012-07-18 ENCOUNTER — Other Ambulatory Visit: Payer: Self-pay | Admitting: Internal Medicine

## 2012-07-18 ENCOUNTER — Encounter: Payer: Self-pay | Admitting: Internal Medicine

## 2012-07-18 ENCOUNTER — Ambulatory Visit (INDEPENDENT_AMBULATORY_CARE_PROVIDER_SITE_OTHER): Payer: Medicare Other | Admitting: *Deleted

## 2012-07-18 DIAGNOSIS — I472 Ventricular tachycardia: Secondary | ICD-10-CM

## 2012-07-18 DIAGNOSIS — I4729 Other ventricular tachycardia: Secondary | ICD-10-CM

## 2012-07-18 DIAGNOSIS — I5022 Chronic systolic (congestive) heart failure: Secondary | ICD-10-CM

## 2012-07-18 DIAGNOSIS — Z95810 Presence of automatic (implantable) cardiac defibrillator: Secondary | ICD-10-CM

## 2012-07-19 LAB — REMOTE ICD DEVICE
AL IMPEDENCE ICD: 460 Ohm
ATRIAL PACING ICD: 15 pct
BAMS-0001: 180 {beats}/min
DEVICE MODEL ICD: 643216
HV IMPEDENCE: 53 Ohm
RV LEAD AMPLITUDE: 11.8 mv
TZAT-0001SLOWVT: 1
TZAT-0004SLOWVT: 8
TZAT-0019SLOWVT: 7.5 V
TZAT-0020SLOWVT: 1 ms
TZON-0003SLOWVT: 340 ms
TZON-0004SLOWVT: 30
TZST-0001SLOWVT: 4
TZST-0003SLOWVT: 36 J
VENTRICULAR PACING ICD: 100 pct

## 2012-08-16 ENCOUNTER — Encounter: Payer: Self-pay | Admitting: *Deleted

## 2012-10-24 ENCOUNTER — Encounter: Payer: Self-pay | Admitting: Internal Medicine

## 2012-10-24 ENCOUNTER — Ambulatory Visit (INDEPENDENT_AMBULATORY_CARE_PROVIDER_SITE_OTHER): Payer: Medicare Other | Admitting: *Deleted

## 2012-10-24 DIAGNOSIS — I4729 Other ventricular tachycardia: Secondary | ICD-10-CM

## 2012-10-24 DIAGNOSIS — I5022 Chronic systolic (congestive) heart failure: Secondary | ICD-10-CM

## 2012-10-24 DIAGNOSIS — I472 Ventricular tachycardia: Secondary | ICD-10-CM

## 2012-10-24 DIAGNOSIS — Z95810 Presence of automatic (implantable) cardiac defibrillator: Secondary | ICD-10-CM

## 2012-10-24 LAB — REMOTE ICD DEVICE
AL IMPEDENCE ICD: 540 Ohm
ATRIAL PACING ICD: 13 pct
BAMS-0003: 90 {beats}/min
BRDY-0002RA: 60 {beats}/min
BRDY-0003RA: 120 {beats}/min
BRDY-0004RA: 120 {beats}/min
LV LEAD IMPEDENCE ICD: 510 Ohm
RV LEAD IMPEDENCE ICD: 630 Ohm
TZAT-0013SLOWVT: 2
TZAT-0020SLOWVT: 1 ms
TZON-0004SLOWVT: 30
TZON-0005SLOWVT: 6
TZST-0001SLOWVT: 2
TZST-0001SLOWVT: 4
TZST-0003SLOWVT: 40 J

## 2012-11-08 ENCOUNTER — Encounter: Payer: Self-pay | Admitting: *Deleted

## 2013-01-20 ENCOUNTER — Encounter: Payer: Self-pay | Admitting: Internal Medicine

## 2013-01-20 ENCOUNTER — Ambulatory Visit (INDEPENDENT_AMBULATORY_CARE_PROVIDER_SITE_OTHER): Payer: Medicare Other | Admitting: Internal Medicine

## 2013-01-20 VITALS — BP 159/76 | HR 76 | Ht 69.0 in | Wt 174.0 lb

## 2013-01-20 DIAGNOSIS — I472 Ventricular tachycardia, unspecified: Secondary | ICD-10-CM

## 2013-01-20 DIAGNOSIS — I429 Cardiomyopathy, unspecified: Secondary | ICD-10-CM

## 2013-01-20 DIAGNOSIS — I5022 Chronic systolic (congestive) heart failure: Secondary | ICD-10-CM

## 2013-01-20 DIAGNOSIS — I442 Atrioventricular block, complete: Secondary | ICD-10-CM

## 2013-01-20 DIAGNOSIS — I4891 Unspecified atrial fibrillation: Secondary | ICD-10-CM

## 2013-01-20 DIAGNOSIS — Z95810 Presence of automatic (implantable) cardiac defibrillator: Secondary | ICD-10-CM

## 2013-01-20 DIAGNOSIS — I10 Essential (primary) hypertension: Secondary | ICD-10-CM

## 2013-01-20 LAB — ICD DEVICE OBSERVATION
AL IMPEDENCE ICD: 600 Ohm
ATRIAL PACING ICD: 13 pct
BAMS-0001: 180 {beats}/min
CHARGE TIME: 9.9 s
LV LEAD THRESHOLD: 0.5 V
MODE SWITCH EPISODES: 6
TOT-0006: 20120522000000
TOT-0007: 0
TZAT-0001SLOWVT: 1
TZAT-0004SLOWVT: 8
TZAT-0012SLOWVT: 200 ms
TZAT-0018SLOWVT: NEGATIVE
TZAT-0019SLOWVT: 7.5 V
TZON-0003SLOWVT: 340 ms
TZST-0001SLOWVT: 3
TZST-0003SLOWVT: 36 J
VENTRICULAR PACING ICD: 99.07 pct

## 2013-01-20 NOTE — Patient Instructions (Signed)
Continue all current medications. Remote check 04/24/2013 Your physician wants you to follow up in:  1 year.  You will receive a reminder letter in the mail one-two months in advance.  If you don't receive a letter, please call our office to schedule the follow up appointment - Dr. Allred  

## 2013-01-20 NOTE — Progress Notes (Signed)
PCP: Eldridge Abrahams, MD   George Medina is a 79 y.o. male who presents today for routine electrophysiology followup.  Since last being seen in our clinic, the patient reports doing very well.  Today, he denies symptoms of palpitations, chest pain, shortness of breath,  lower extremity edema, dizziness, presyncope, syncope, or ICD shocks.  The patient is otherwise without complaint today.   Past Medical History  Diagnosis Date  . Nonischemic cardiomyopathy   . Nonsustained ventricular tachycardia   . CHF (congestive heart failure)     chronic-systolic now class I-II  . A-fib     anticoagulated with coumadin  . Complete heart block   . Hypertension   . Diabetes mellitus   . Brain aneurysm 1994    required surgery x2   Past Surgical History  Procedure Laterality Date  . Cardiac defibrillator placement      most recent gen change 5/12,  he has a SJM 7001 lead which was fluroscopically normal during generator change 5/12.  LV and RV leads were switched in the header  . Cerebral aneurysm repair      x2    Current Outpatient Prescriptions  Medication Sig Dispense Refill  . albuterol (PROVENTIL HFA;VENTOLIN HFA) 108 (90 BASE) MCG/ACT inhaler Inhale 2 puffs into the lungs every 6 (six) hours as needed.        Marland Kitchen albuterol (PROVENTIL) (2.5 MG/3ML) 0.083% nebulizer solution Take 3 mLs by nebulization Once daily as needed.      . carvedilol (COREG) 25 MG tablet Take 25 mg by mouth 2 (two) times daily with a meal.        . cholecalciferol (VITAMIN D) 1000 UNITS tablet Take 1,000 Units by mouth daily.        . fluticasone (FLONASE) 50 MCG/ACT nasal spray Place 1 spray into the nose as needed.       . furosemide (LASIX) 20 MG tablet Take 20 mg by mouth as needed.       Marland Kitchen glipiZIDE (GLUCOTROL XL) 2.5 MG 24 hr tablet Take 2.5 mg by mouth daily.        . metFORMIN (GLUCOPHAGE) 1000 MG tablet Take 1,000 mg by mouth 2 (two) times daily with a meal.       . NEXIUM 40 MG capsule Take 40 mg by  mouth daily before breakfast.       . ramipril (ALTACE) 10 MG capsule Take 10 mg by mouth daily.        . sitaGLIPtin (JANUVIA) 100 MG tablet Take 100 mg by mouth daily.      . SYMBICORT 160-4.5 MCG/ACT inhaler Inhale 2 puffs into the lungs 2 (two) times daily.       Marland Kitchen VYTORIN 10-40 MG per tablet Take 0.5 tablets by mouth Daily.      Marland Kitchen warfarin (COUMADIN) 5 MG tablet Take 5 mg by mouth daily. Take 1 tablet Monday to Friday not on Saturday.      . finasteride (PROSCAR) 5 MG tablet Take 5 mg by mouth daily.        No current facility-administered medications for this visit.    Physical Exam: Filed Vitals:   01/20/13 0943  BP: 159/76  Pulse: 76  Height: 5\' 9"  (1.753 m)  Weight: 174 lb (78.926 kg)    GEN- The patient is well appearing, alert and oriented x 3 today.   Head- normocephalic, atraumatic Eyes-  Sclera clear, conjunctiva pink Ears- hearing intact Oropharynx- clear Lungs- Clear to ausculation bilaterally, normal  work of breathing Chest- ICD pocket is well healed Heart- Regular rate and rhythm, no murmurs, rubs or gallops, PMI not laterally displaced GI- soft, NT, ND, + BS Extremities- no clubbing, cyanosis, or edema  ICD interrogation- reviewed in detail today,  See PACEART report  Assessment and Plan:  1. Chronic systolic dysfunction euvolemic today No changes  2. Complete heart block Normal BiV ICD function See Pace Art report No changes today  3. Atrial fibrillation Appropriately anticoagulated with coumadin Afib burden remains low     4. HTN Elevated today but he has not taken his home medicine    No change required today     5. VT Well controlled   No arrhythmias since last interrogation No changes Normal ICD function     Merlin Return in 1 year

## 2013-03-09 IMAGING — CR DG CHEST 2V
2 series · 2 of 2 positions shown · non-contrast
Comparison: 05/06/2005

CLINICAL DATA: Pre-op generator change

CHEST - 2 VIEW

[view not recorded (1 of 2)]
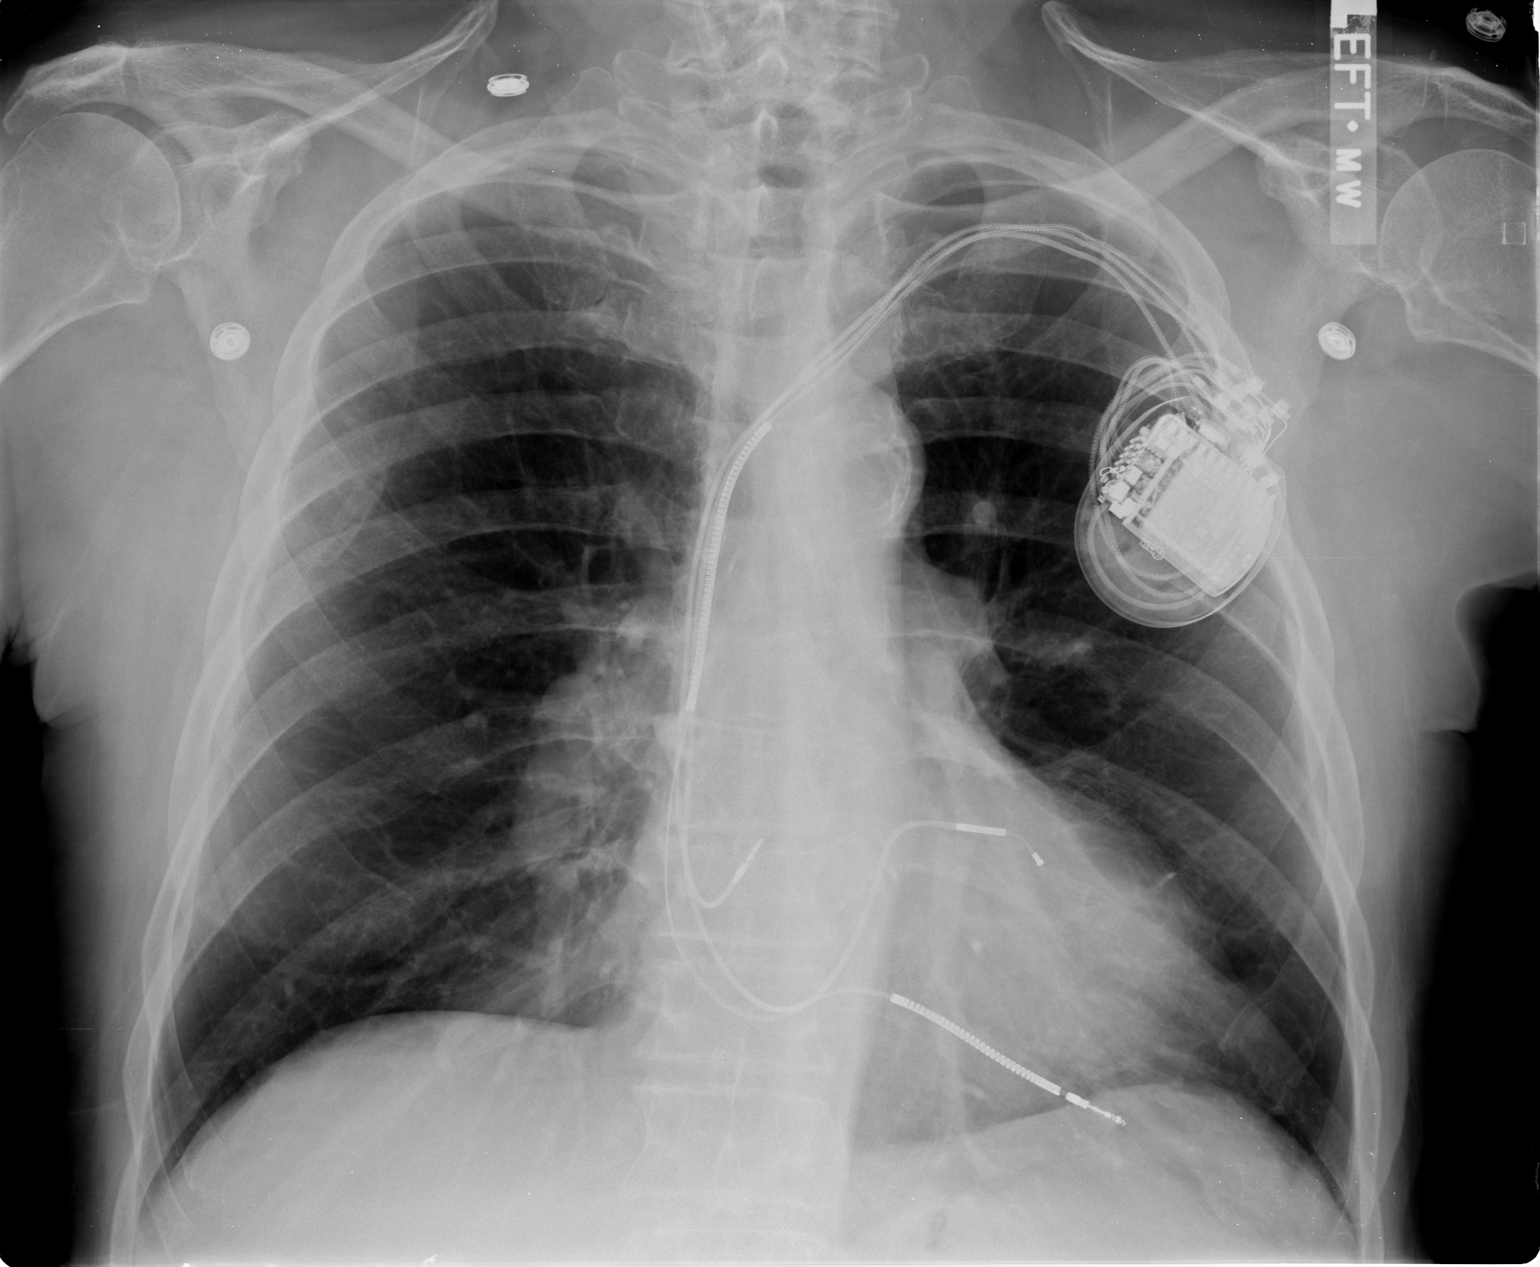

[view not recorded (2 of 2)]
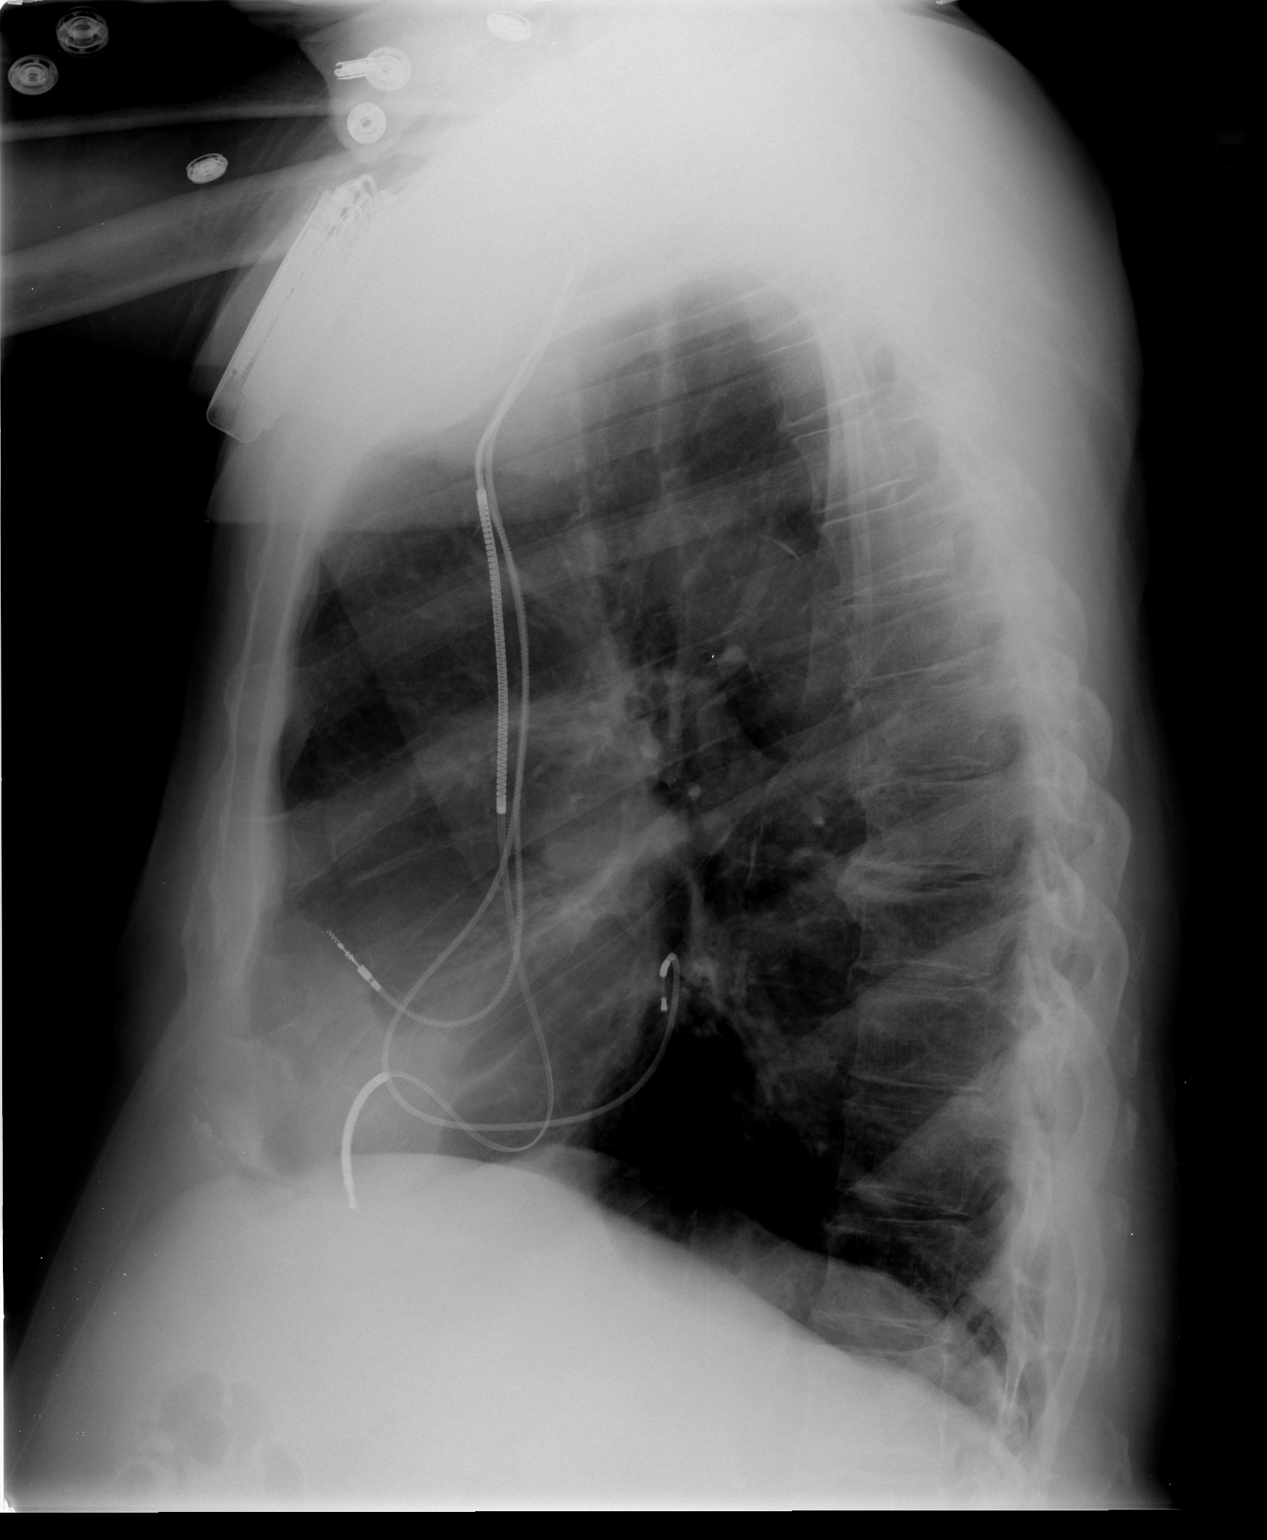

[2 of 2 positions shown; findings below may reference images not displayed]

FINDINGS: Lungs are clear. No pleural effusion or pneumothorax.

Heart is top normal in size.  Stable enlargement of the pulmonary
vessels.  Atherosclerotic calcifications of the aortic arch.  Left
subclavian ICD.

Degenerative changes of the visualized thoracolumbar spine.
IMPRESSION: No evidence of acute cardiopulmonary disease.

## 2013-04-24 ENCOUNTER — Ambulatory Visit (INDEPENDENT_AMBULATORY_CARE_PROVIDER_SITE_OTHER): Payer: Medicare Other | Admitting: *Deleted

## 2013-04-24 ENCOUNTER — Encounter: Payer: Self-pay | Admitting: Internal Medicine

## 2013-04-24 DIAGNOSIS — I4891 Unspecified atrial fibrillation: Secondary | ICD-10-CM

## 2013-04-24 DIAGNOSIS — I5022 Chronic systolic (congestive) heart failure: Secondary | ICD-10-CM

## 2013-04-24 LAB — MDC_IDC_ENUM_SESS_TYPE_REMOTE
Battery Remaining Longevity: 59 mo
Battery Remaining Percentage: 67 %
Brady Statistic AP VP Percent: 6.5 %
Brady Statistic AP VS Percent: 1 %
Brady Statistic AS VP Percent: 92 %
Date Time Interrogation Session: 20141222123801
HighPow Impedance: 54 Ohm
Lead Channel Impedance Value: 650 Ohm
Lead Channel Pacing Threshold Amplitude: 0.75 V
Lead Channel Pacing Threshold Pulse Width: 0.5 ms
Lead Channel Pacing Threshold Pulse Width: 0.5 ms
Lead Channel Sensing Intrinsic Amplitude: 3.1 mV
Lead Channel Setting Pacing Amplitude: 2 V
Lead Channel Setting Pacing Amplitude: 2.5 V
Lead Channel Setting Pacing Pulse Width: 0.5 ms
Zone Setting Detection Interval: 300 ms

## 2013-04-30 IMAGING — CR DG CHEST 1V PORT
1 series · 1 of 1 positions shown · non-contrast
Comparison: Two-view chest x-ray 09/23/2010 and portable chest x-
ray 05/06/2005.

CLINICAL DATA: Hypoxia.

PORTABLE CHEST - 1 VIEW [DATE]/[id number] hours:

[view not recorded]
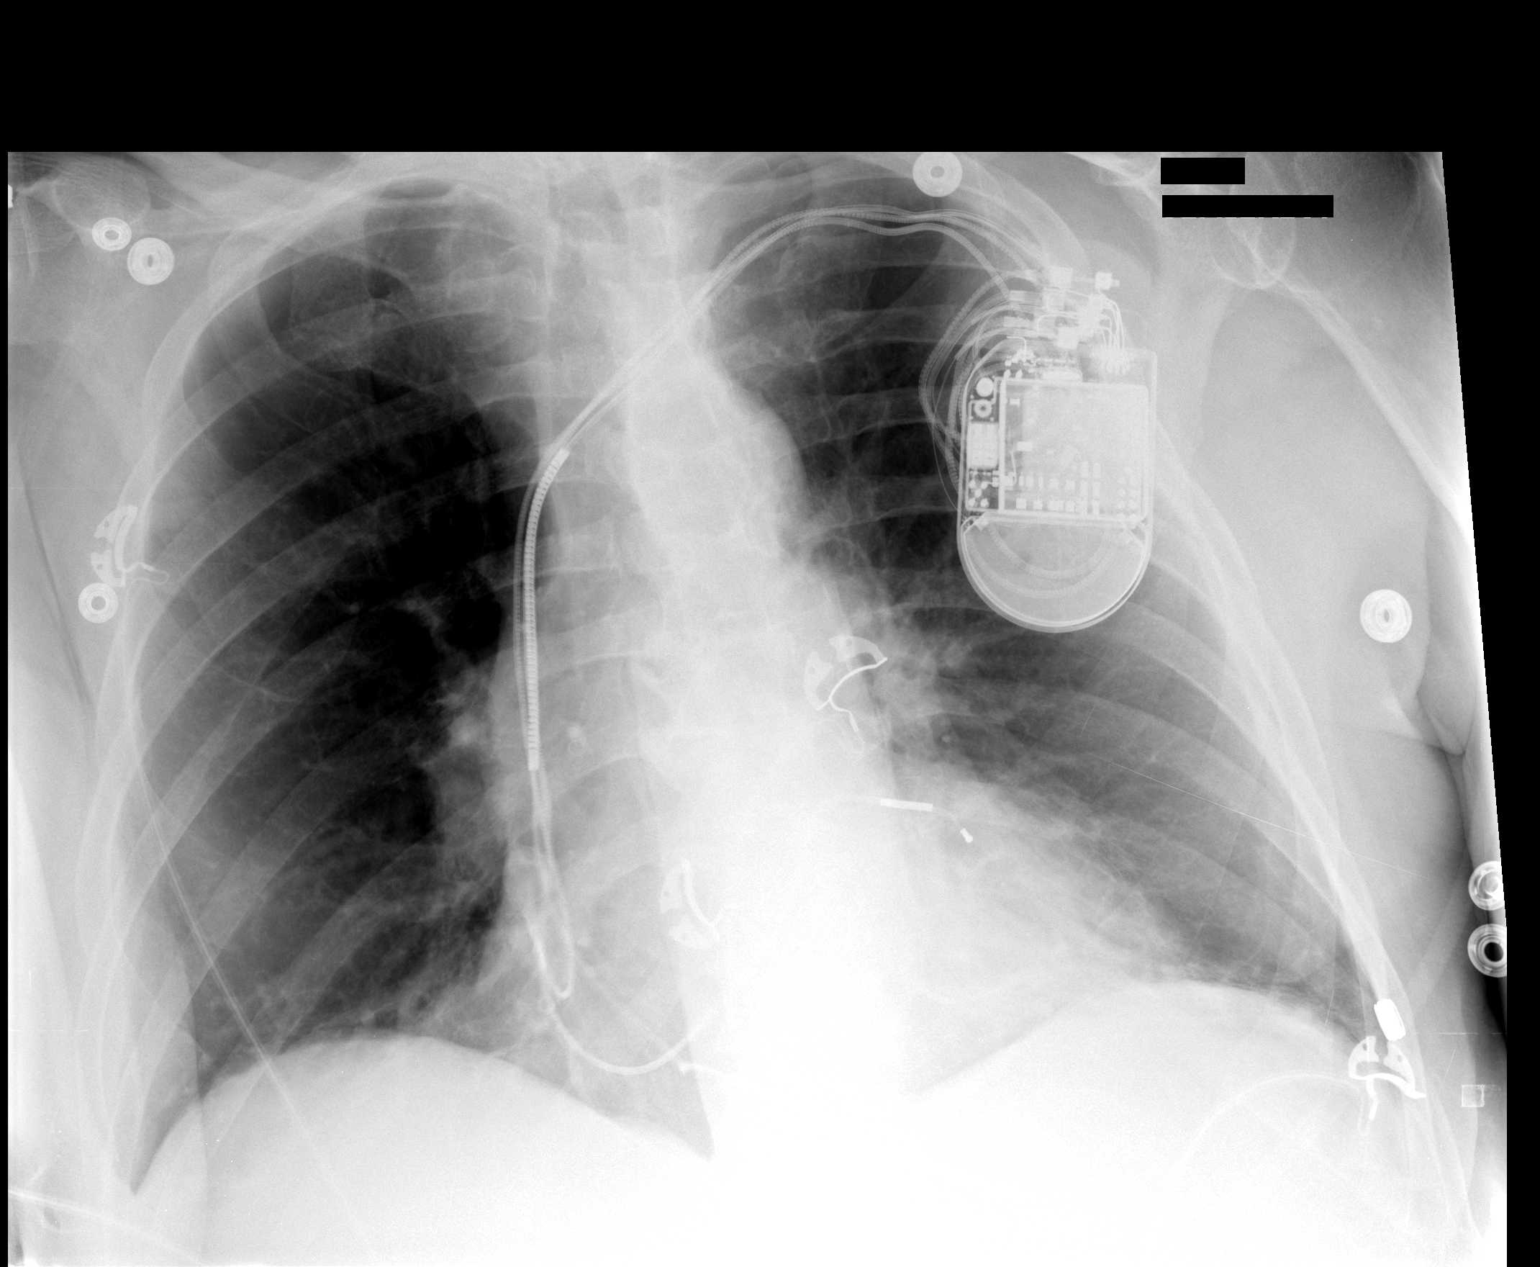

[1 of 1 positions shown; findings below may reference images not displayed]

FINDINGS: Cardiac silhouette mildly enlarged but stable.
Indwelling left subclavian pacing defibrillator intact.  Mild
atelectasis at the left lung base.  Lungs otherwise clear.
Pulmonary vascularity normal.  No visible pleural effusions.
IMPRESSION: Stable mild cardiomegaly without pulmonary edema.  Mild left
basilar atelectasis.  No acute cardiopulmonary disease otherwise.

## 2013-05-11 ENCOUNTER — Encounter: Payer: Self-pay | Admitting: *Deleted

## 2013-07-26 ENCOUNTER — Ambulatory Visit (INDEPENDENT_AMBULATORY_CARE_PROVIDER_SITE_OTHER): Payer: Medicare Other | Admitting: *Deleted

## 2013-07-26 DIAGNOSIS — I4729 Other ventricular tachycardia: Secondary | ICD-10-CM

## 2013-07-26 DIAGNOSIS — I472 Ventricular tachycardia: Secondary | ICD-10-CM

## 2013-07-26 DIAGNOSIS — I429 Cardiomyopathy, unspecified: Secondary | ICD-10-CM

## 2013-07-26 DIAGNOSIS — I5022 Chronic systolic (congestive) heart failure: Secondary | ICD-10-CM

## 2013-07-26 DIAGNOSIS — I442 Atrioventricular block, complete: Secondary | ICD-10-CM

## 2013-07-26 DIAGNOSIS — I4891 Unspecified atrial fibrillation: Secondary | ICD-10-CM

## 2013-07-26 LAB — MDC_IDC_ENUM_SESS_TYPE_REMOTE
Battery Remaining Longevity: 54 mo
Brady Statistic AP VS Percent: 1 %
Brady Statistic AS VS Percent: 1 %
Brady Statistic RA Percent Paced: 5.2 %
Date Time Interrogation Session: 20150325071300
HighPow Impedance: 50 Ohm
Implantable Pulse Generator Serial Number: 643216
Lead Channel Impedance Value: 440 Ohm
Lead Channel Impedance Value: 560 Ohm
Lead Channel Impedance Value: 590 Ohm
Lead Channel Pacing Threshold Amplitude: 0.5 V
Lead Channel Pacing Threshold Amplitude: 0.75 V
Lead Channel Pacing Threshold Amplitude: 1 V
Lead Channel Pacing Threshold Pulse Width: 0.5 ms
Lead Channel Pacing Threshold Pulse Width: 0.5 ms
Lead Channel Sensing Intrinsic Amplitude: 2.4 mV
Lead Channel Setting Pacing Amplitude: 2 V
Lead Channel Setting Pacing Amplitude: 2.5 V
Lead Channel Setting Pacing Pulse Width: 0.5 ms
Lead Channel Setting Sensing Sensitivity: 2 mV
MDC IDC MSMT BATTERY REMAINING PERCENTAGE: 63 %
MDC IDC MSMT BATTERY VOLTAGE: 2.93 V
MDC IDC MSMT LEADCHNL RA PACING THRESHOLD PULSEWIDTH: 0.5 ms
MDC IDC MSMT LEADCHNL RV SENSING INTR AMPL: 11.8 mV
MDC IDC SET LEADCHNL LV PACING PULSEWIDTH: 0.5 ms
MDC IDC SET LEADCHNL RA PACING AMPLITUDE: 2 V
MDC IDC SET ZONE DETECTION INTERVAL: 340 ms
MDC IDC STAT BRADY AP VP PERCENT: 5.5 %
MDC IDC STAT BRADY AS VP PERCENT: 93 %
Zone Setting Detection Interval: 300 ms

## 2013-08-11 ENCOUNTER — Encounter: Payer: Self-pay | Admitting: *Deleted

## 2013-08-21 ENCOUNTER — Encounter: Payer: Self-pay | Admitting: Internal Medicine

## 2013-10-31 ENCOUNTER — Ambulatory Visit (INDEPENDENT_AMBULATORY_CARE_PROVIDER_SITE_OTHER): Payer: Medicare Other | Admitting: *Deleted

## 2013-10-31 DIAGNOSIS — I5022 Chronic systolic (congestive) heart failure: Secondary | ICD-10-CM

## 2013-10-31 DIAGNOSIS — I472 Ventricular tachycardia: Secondary | ICD-10-CM

## 2013-10-31 DIAGNOSIS — I429 Cardiomyopathy, unspecified: Secondary | ICD-10-CM

## 2013-10-31 DIAGNOSIS — I4729 Other ventricular tachycardia: Secondary | ICD-10-CM

## 2013-10-31 NOTE — Progress Notes (Signed)
Remote ICD transmission.   

## 2013-11-09 ENCOUNTER — Encounter: Payer: Self-pay | Admitting: *Deleted

## 2013-11-10 LAB — MDC_IDC_ENUM_SESS_TYPE_REMOTE
Brady Statistic AP VP Percent: 11 %
Brady Statistic AP VS Percent: 1 %
Brady Statistic AS VS Percent: 1 %
Brady Statistic RA Percent Paced: 11 %
Date Time Interrogation Session: 20150630095104
HIGH POWER IMPEDANCE MEASURED VALUE: 43 Ohm
Lead Channel Impedance Value: 410 Ohm
Lead Channel Impedance Value: 440 Ohm
Lead Channel Impedance Value: 610 Ohm
Lead Channel Pacing Threshold Amplitude: 0.5 V
Lead Channel Pacing Threshold Amplitude: 1 V
Lead Channel Pacing Threshold Pulse Width: 0.5 ms
Lead Channel Pacing Threshold Pulse Width: 0.5 ms
Lead Channel Pacing Threshold Pulse Width: 0.5 ms
Lead Channel Sensing Intrinsic Amplitude: 11.8 mV
Lead Channel Setting Pacing Amplitude: 2 V
Lead Channel Setting Pacing Amplitude: 2 V
Lead Channel Setting Pacing Amplitude: 2.5 V
Lead Channel Setting Pacing Pulse Width: 0.5 ms
Lead Channel Setting Sensing Sensitivity: 2 mV
MDC IDC MSMT BATTERY REMAINING LONGEVITY: 50 mo
MDC IDC MSMT BATTERY REMAINING PERCENTAGE: 60 %
MDC IDC MSMT BATTERY VOLTAGE: 2.93 V
MDC IDC MSMT LEADCHNL RA PACING THRESHOLD AMPLITUDE: 0.75 V
MDC IDC MSMT LEADCHNL RA SENSING INTR AMPL: 2.2 mV
MDC IDC PG SERIAL: 643216
MDC IDC SET LEADCHNL RV PACING PULSEWIDTH: 0.5 ms
MDC IDC SET ZONE DETECTION INTERVAL: 300 ms
MDC IDC STAT BRADY AS VP PERCENT: 88 %
Zone Setting Detection Interval: 340 ms

## 2013-11-21 ENCOUNTER — Encounter: Payer: Self-pay | Admitting: Cardiology

## 2013-12-11 ENCOUNTER — Telehealth: Payer: Self-pay | Admitting: *Deleted

## 2013-12-11 ENCOUNTER — Encounter: Payer: Self-pay | Admitting: *Deleted

## 2013-12-11 ENCOUNTER — Ambulatory Visit (INDEPENDENT_AMBULATORY_CARE_PROVIDER_SITE_OTHER): Payer: Medicare Other | Admitting: *Deleted

## 2013-12-11 DIAGNOSIS — I5022 Chronic systolic (congestive) heart failure: Secondary | ICD-10-CM

## 2013-12-11 DIAGNOSIS — Z95810 Presence of automatic (implantable) cardiac defibrillator: Secondary | ICD-10-CM

## 2013-12-11 NOTE — Progress Notes (Signed)
EPIC Encounter for ICM Monitoring  Patient Name: George Medina is a 80 y.o. male Date: 12/11/2013 Primary Care Physican: Clinton Quant, MD Primary Cardiologist: Allred Electrophysiologist: Allred Dry Weight: 184 lbs  Bi-V pacing: 99%       In the past month, have you:  1. Gained more than 2 pounds in a day or more than 5 pounds in a week? no  2. Had changes in your medications (with verification of current medications)? no  3. Had more shortness of breath than is usual for you? no  4. Limited your activity because of shortness of breath? no  5. Not been able to sleep because of shortness of breath? no  6. Had increased swelling in your feet or ankles? no  7. Had symptoms of dehydration (dizziness, dry mouth, increased thirst, decreased urine output) no  8. Had changes in sodium restriction? no  9. Been compliant with medication? Yes   ICM trend:   Follow-up plan: ICM clinic phone appointment: 02/26/14. Corvue readings slightly elevated ~7/30-8/6. [name] weight was stable durijng this time and he was not having any other symptoms. No changes made today. Routine follow up with Dr. Rayann Heman on 01/26/14.  Copy of note sent to patient's primary care physician, primary cardiologist, and device following physician.  Alvis Lemmings, RN, BSN 12/11/2013 1:52 PM

## 2013-12-11 NOTE — Telephone Encounter (Signed)
ICM transmission received. I left a message for the patient to call. 

## 2013-12-11 NOTE — Telephone Encounter (Signed)
I spoke with the patient. 

## 2013-12-28 ENCOUNTER — Telehealth: Payer: Self-pay | Admitting: *Deleted

## 2013-12-28 ENCOUNTER — Encounter: Payer: Self-pay | Admitting: Internal Medicine

## 2013-12-28 NOTE — Telephone Encounter (Signed)
Message received from Dr. Rayann Heman that based on last corvue reading, we should re-check his corvue as the patient may need an additional lasix x 2 days. I have a left a message for the patient to call and discuss. I advised I will be out of the office tomorrow and if I don't hear from him today, I will call him back on Monday.

## 2014-01-26 ENCOUNTER — Ambulatory Visit (INDEPENDENT_AMBULATORY_CARE_PROVIDER_SITE_OTHER): Payer: Medicare Other | Admitting: Internal Medicine

## 2014-01-26 ENCOUNTER — Encounter: Payer: Self-pay | Admitting: Internal Medicine

## 2014-01-26 ENCOUNTER — Other Ambulatory Visit: Payer: Self-pay | Admitting: *Deleted

## 2014-01-26 VITALS — BP 148/81 | HR 61 | Ht 70.0 in | Wt 178.2 lb

## 2014-01-26 DIAGNOSIS — R059 Cough, unspecified: Secondary | ICD-10-CM

## 2014-01-26 DIAGNOSIS — I429 Cardiomyopathy, unspecified: Secondary | ICD-10-CM

## 2014-01-26 DIAGNOSIS — I480 Paroxysmal atrial fibrillation: Secondary | ICD-10-CM

## 2014-01-26 DIAGNOSIS — R05 Cough: Secondary | ICD-10-CM

## 2014-01-26 DIAGNOSIS — I442 Atrioventricular block, complete: Secondary | ICD-10-CM

## 2014-01-26 DIAGNOSIS — I4729 Other ventricular tachycardia: Secondary | ICD-10-CM

## 2014-01-26 DIAGNOSIS — I5022 Chronic systolic (congestive) heart failure: Secondary | ICD-10-CM

## 2014-01-26 DIAGNOSIS — I472 Ventricular tachycardia, unspecified: Secondary | ICD-10-CM

## 2014-01-26 DIAGNOSIS — I4891 Unspecified atrial fibrillation: Secondary | ICD-10-CM

## 2014-01-26 DIAGNOSIS — I10 Essential (primary) hypertension: Secondary | ICD-10-CM

## 2014-01-26 LAB — MDC_IDC_ENUM_SESS_TYPE_INCLINIC
Battery Remaining Longevity: 50.4 mo
Brady Statistic RA Percent Paced: 14 %
Brady Statistic RV Percent Paced: 99.07 %
HIGH POWER IMPEDANCE MEASURED VALUE: 48.5848
Lead Channel Impedance Value: 450 Ohm
Lead Channel Impedance Value: 487.5 Ohm
Lead Channel Impedance Value: 650 Ohm
Lead Channel Pacing Threshold Amplitude: 1 V
Lead Channel Pacing Threshold Pulse Width: 0.5 ms
Lead Channel Sensing Intrinsic Amplitude: 2.4 mV
Lead Channel Setting Pacing Amplitude: 2 V
Lead Channel Setting Pacing Amplitude: 2 V
Lead Channel Setting Pacing Amplitude: 2.5 V
Lead Channel Setting Pacing Pulse Width: 0.5 ms
Lead Channel Setting Pacing Pulse Width: 0.5 ms
Lead Channel Setting Sensing Sensitivity: 2 mV
MDC IDC MSMT LEADCHNL LV PACING THRESHOLD AMPLITUDE: 0.5 V
MDC IDC MSMT LEADCHNL LV PACING THRESHOLD PULSEWIDTH: 0.5 ms
MDC IDC MSMT LEADCHNL RA PACING THRESHOLD AMPLITUDE: 0.5 V
MDC IDC MSMT LEADCHNL RV PACING THRESHOLD PULSEWIDTH: 0.5 ms
MDC IDC PG SERIAL: 643216
MDC IDC SESS DTM: 20150925113413
MDC IDC SET ZONE DETECTION INTERVAL: 300 ms
MDC IDC SET ZONE DETECTION INTERVAL: 340 ms

## 2014-01-26 NOTE — Patient Instructions (Addendum)
Your physician recommends that you schedule a follow-up appointment in: 1 year with Dr. Rayann Heman. You will receive a reminder letter in the mail in about 10 months reminding you to call and schedule your appointment. If you don't receive this letter, please contact our office. Merlin device check on 04/30/14. Your physician recommends that you continue on your current medications as directed. Please refer to the Current Medication list given to you today. A chest x-ray takes a picture of the organs and structures inside the chest, including the heart, lungs, and blood vessels. This test can show several things, including, whether the heart is enlarges; whether fluid is building up in the lungs; and whether pacemaker / defibrillator leads are still in place.

## 2014-01-26 NOTE — Progress Notes (Signed)
PCP: Clinton Quant, MD   George Medina is a 80 y.o. male who presents today for routine electrophysiology followup.  Since last being seen in our clinic, the patient reports doing very well.  Today, he denies symptoms of palpitations, chest pain, shortness of breath,  lower extremity edema, dizziness, presyncope, syncope, or ICD shocks.  + cough. The patient is otherwise without complaint today.   Past Medical History  Diagnosis Date  . Nonischemic cardiomyopathy   . Nonsustained ventricular tachycardia   . CHF (congestive heart failure)     chronic-systolic now class I-II  . A-fib     anticoagulated with coumadin  . Complete heart block   . Hypertension   . Diabetes mellitus   . Brain aneurysm 1994    required surgery x2   Past Surgical History  Procedure Laterality Date  . Cardiac defibrillator placement      most recent gen change 5/12,  he has a SJM 7001 lead which was fluroscopically normal during generator change 5/12.  LV and RV leads were switched in the header  . Cerebral aneurysm repair      x2    Current Outpatient Prescriptions  Medication Sig Dispense Refill  . albuterol (PROVENTIL HFA;VENTOLIN HFA) 108 (90 BASE) MCG/ACT inhaler Inhale 2 puffs into the lungs every 6 (six) hours as needed.        Marland Kitchen albuterol (PROVENTIL) (2.5 MG/3ML) 0.083% nebulizer solution Take 3 mLs by nebulization Once daily as needed.      . carvedilol (COREG) 25 MG tablet Take 25 mg by mouth 2 (two) times daily with a meal.        . cholecalciferol (VITAMIN D) 1000 UNITS tablet Take 1,000 Units by mouth daily.        . finasteride (PROSCAR) 5 MG tablet Take 5 mg by mouth daily.       . fluticasone (FLONASE) 50 MCG/ACT nasal spray Place 1 spray into the nose as needed.       . furosemide (LASIX) 20 MG tablet Take 20 mg by mouth as needed.       Marland Kitchen glipiZIDE (GLUCOTROL XL) 2.5 MG 24 hr tablet Take 2.5 mg by mouth daily.        . metFORMIN (GLUCOPHAGE) 1000 MG tablet Take 1,000 mg by mouth 2  (two) times daily with a meal.       . NEXIUM 40 MG capsule Take 40 mg by mouth daily before breakfast.       . ramipril (ALTACE) 10 MG capsule Take 10 mg by mouth daily.        . sitaGLIPtin (JANUVIA) 100 MG tablet Take 100 mg by mouth daily.      . SYMBICORT 160-4.5 MCG/ACT inhaler Inhale 2 puffs into the lungs 2 (two) times daily.       Marland Kitchen VYTORIN 10-40 MG per tablet Take 0.5 tablets by mouth Daily.      Marland Kitchen warfarin (COUMADIN) 5 MG tablet Take 5 mg by mouth daily. Take 1 tablet Monday to Friday not on Saturday.       No current facility-administered medications for this visit.    Physical Exam: Filed Vitals:   01/26/14 1110  BP: 148/81  Pulse: 61  Height: 5\' 10"  (1.778 m)  Weight: 178 lb 4 oz (80.854 kg)  SpO2: 95%    GEN- The patient is well appearing, alert and oriented x 3 today.   Head- normocephalic, atraumatic Eyes-  Sclera clear, conjunctiva pink Ears- hearing intact Oropharynx-  clear Lungs- coarse rhonchii at the left base, normal work of breathing Chest- ICD pocket is well healed Heart- Regular rate and rhythm, no murmurs, rubs or gallops, PMI not laterally displaced GI- soft, NT, ND, + BS Extremities- no clubbing, cyanosis, or edema  ICD interrogation- reviewed in detail today,  See PACEART report  Assessment and Plan:  1. Chronic systolic dysfunction euvolemic today No changes  2. Complete heart block Normal BiV ICD function See Pace Art report No changes today  3. Atrial fibrillation Appropriately anticoagulated with coumadin Afib burden remains low     4. HTN Stable No change required today     5. VT Well controlled   No arrhythmias since last interrogation No changes Normal ICD function    6. Abnormal chest exam and cough I have encourage chest Xray He prefers to have his primary pulmonologist perform this in [place].  I called Dr Linde Gillis (his primary pulmonologist) today but am told that he is out until Monday.  I have provided  my cell number for him to call. The patient would prefer to make arrangements himself for CXR next week.  If he is unable to do so then I am happy to assist   Merlin Return in 1 year

## 2014-02-06 ENCOUNTER — Telehealth: Payer: Self-pay | Admitting: Internal Medicine

## 2014-02-06 NOTE — Telephone Encounter (Signed)
Spoke with patient and he advised nurse that his cxr was done by Dr. Hans Eden office.

## 2014-02-06 NOTE — Telephone Encounter (Addendum)
New message ° ° ° ° ° °Returning a nurses call °

## 2014-02-26 ENCOUNTER — Telehealth: Payer: Self-pay | Admitting: Cardiology

## 2014-02-26 ENCOUNTER — Encounter: Payer: Self-pay | Admitting: *Deleted

## 2014-02-26 ENCOUNTER — Ambulatory Visit (INDEPENDENT_AMBULATORY_CARE_PROVIDER_SITE_OTHER): Payer: Medicare Other | Admitting: *Deleted

## 2014-02-26 DIAGNOSIS — I5022 Chronic systolic (congestive) heart failure: Secondary | ICD-10-CM

## 2014-02-26 DIAGNOSIS — Z95810 Presence of automatic (implantable) cardiac defibrillator: Secondary | ICD-10-CM

## 2014-02-26 NOTE — Telephone Encounter (Signed)
Spoke with pt and reminded pt of remote transmission that is due today. Pt verbalized understanding.   

## 2014-02-26 NOTE — Progress Notes (Signed)
EPIC Encounter for ICM Monitoring  Patient Name: George Medina is a 80 y.o. male Date: 02/26/2014 Primary Care Physican: Clinton Quant, MD Primary Cardiologist: Allred Electrophysiologist: Allred Dry Weight: 184 lbs  Bi-V pacing: >99%       In the past month, have you:  1. Gained more than 2 pounds in a day or more than 5 pounds in a week? no  2. Had changes in your medications (with verification of current medications)? no  3. Had more shortness of breath than is usual for you? no  4. Limited your activity because of shortness of breath? no  5. Not been able to sleep because of shortness of breath? no  6. Had increased swelling in your feet or ankles? no  7. Had symptoms of dehydration (dizziness, dry mouth, increased thirst, decreased urine output) no  8. Had changes in sodium restriction? no  9. Been compliant with medication? Yes   ICM trend:   Follow-up plan: ICM clinic phone appointment: 04/02/14. [name] fluid readings were up from ~10/9-10/20. He does not recall any change in symptoms or diet during that time. He states he has been bowling and raking leaves and is able to do the things he wants to do. No changes made today.  Copy of note sent to patient's primary care physician, primary cardiologist, and device following physician.  Alvis Lemmings, RN, BSN 02/26/2014 2:31 PM

## 2014-04-02 ENCOUNTER — Ambulatory Visit (INDEPENDENT_AMBULATORY_CARE_PROVIDER_SITE_OTHER): Payer: Medicare Other | Admitting: *Deleted

## 2014-04-02 DIAGNOSIS — I5022 Chronic systolic (congestive) heart failure: Secondary | ICD-10-CM

## 2014-04-02 DIAGNOSIS — Z95810 Presence of automatic (implantable) cardiac defibrillator: Secondary | ICD-10-CM

## 2014-04-05 ENCOUNTER — Telehealth: Payer: Self-pay | Admitting: *Deleted

## 2014-04-05 NOTE — Telephone Encounter (Signed)
ICM transmission received. I left a message for the patient to call. 

## 2014-04-06 ENCOUNTER — Encounter: Payer: Self-pay | Admitting: *Deleted

## 2014-04-06 NOTE — Telephone Encounter (Signed)
I spoke with the patient. 

## 2014-04-06 NOTE — Progress Notes (Signed)
EPIC Encounter for ICM Monitoring  Patient Name: George Medina is a 80 y.o. male Date: 04/06/2014 Primary Care Physican: Clinton Quant, MD Primary Cardiologist: Allred Electrophysiologist: Allred Dry Weight: 184 lbs  Bi-V pacing: > 99%       In the past month, have you:  1. Gained more than 2 pounds in a day or more than 5 pounds in a week? No. If anything, this has gone down for him at times.  2. Had changes in your medications (with verification of current medications)? no  3. Had more shortness of breath than is usual for you? no  4. Limited your activity because of shortness of breath? no  5. Not been able to sleep because of shortness of breath? no  6. Had increased swelling in your feet or ankles? no  7. Had symptoms of dehydration (dizziness, dry mouth, increased thirst, decreased urine output) no  8. Had changes in sodium restriction? no  9. Been compliant with medication? Yes   ICM trend:   Follow-up plan: ICM clinic phone appointment: 05/10/14. [name] corvue readings were elevated from ~11/2-11/12. His current impedence is below baseline from ~11/25- current. He has been asymptomatic during these times. He did eat at little more than usual over Thanksgiving. His weight at been stable. No changes made today.   Copy of note sent to patient's primary care physician, primary cardiologist, and device following physician.  George Lemmings, RN, BSN 04/06/2014 4:21 PM

## 2014-05-10 ENCOUNTER — Ambulatory Visit (INDEPENDENT_AMBULATORY_CARE_PROVIDER_SITE_OTHER): Payer: Medicare Other | Admitting: *Deleted

## 2014-05-10 DIAGNOSIS — I4729 Other ventricular tachycardia: Secondary | ICD-10-CM

## 2014-05-10 DIAGNOSIS — I472 Ventricular tachycardia: Secondary | ICD-10-CM

## 2014-05-10 LAB — MDC_IDC_ENUM_SESS_TYPE_REMOTE
Battery Remaining Longevity: 46 mo
Battery Voltage: 2.92 V
Brady Statistic AP VP Percent: 6.3 %
Brady Statistic AP VS Percent: 1 %
Brady Statistic AS VS Percent: 1 %
Brady Statistic RA Percent Paced: 6 %
Date Time Interrogation Session: 20160107150324
HIGH POWER IMPEDANCE MEASURED VALUE: 50 Ohm
Implantable Pulse Generator Serial Number: 643216
Lead Channel Pacing Threshold Amplitude: 0.5 V
Lead Channel Pacing Threshold Amplitude: 1 V
Lead Channel Pacing Threshold Pulse Width: 0.5 ms
Lead Channel Pacing Threshold Pulse Width: 0.5 ms
Lead Channel Sensing Intrinsic Amplitude: 10 mV
Lead Channel Sensing Intrinsic Amplitude: 2.9 mV
Lead Channel Setting Pacing Amplitude: 2 V
Lead Channel Setting Pacing Amplitude: 2 V
Lead Channel Setting Pacing Pulse Width: 0.5 ms
MDC IDC MSMT BATTERY REMAINING PERCENTAGE: 54 %
MDC IDC MSMT LEADCHNL LV IMPEDANCE VALUE: 400 Ohm
MDC IDC MSMT LEADCHNL LV PACING THRESHOLD PULSEWIDTH: 0.5 ms
MDC IDC MSMT LEADCHNL RA IMPEDANCE VALUE: 560 Ohm
MDC IDC MSMT LEADCHNL RA PACING THRESHOLD AMPLITUDE: 0.5 V
MDC IDC MSMT LEADCHNL RV IMPEDANCE VALUE: 590 Ohm
MDC IDC SET LEADCHNL RV PACING AMPLITUDE: 2.5 V
MDC IDC SET LEADCHNL RV PACING PULSEWIDTH: 0.5 ms
MDC IDC SET LEADCHNL RV SENSING SENSITIVITY: 2 mV
MDC IDC STAT BRADY AS VP PERCENT: 93 %
MDC IDC STAT BRADY RV PERCENT PACED: 99 % — AB
Zone Setting Detection Interval: 300 ms
Zone Setting Detection Interval: 340 ms

## 2014-05-10 NOTE — Progress Notes (Signed)
Remote ICD transmission.   

## 2014-05-23 ENCOUNTER — Encounter: Payer: Self-pay | Admitting: Cardiology

## 2014-05-31 ENCOUNTER — Encounter: Payer: Self-pay | Admitting: Internal Medicine

## 2014-06-01 ENCOUNTER — Encounter: Payer: Self-pay | Admitting: Internal Medicine

## 2014-06-27 ENCOUNTER — Encounter: Payer: Self-pay | Admitting: *Deleted

## 2014-07-05 ENCOUNTER — Telehealth: Payer: Self-pay | Admitting: Internal Medicine

## 2014-07-05 NOTE — Telephone Encounter (Signed)
I called and spoke with the patient. He states he got a bill in the mail for over $200 for his device check and $90 of this was not covered. The patient did not have a date of service for this charge that he could see on his bill. He states he has medicare and a supplemental insurance. He states that the supplemental insurance is changing from [place] to [place]. He was concerned about the charge and called the insurance company. They stated the charge was for his fluid readings. I advised the patient I have not checked him since November, so I am uncertain why he would have the $90 charge. He did have a full remote on 05/10/14. The patient would like to discontinue monthly ICM monitoring at this time. I advised him to please continue to monitor his symptoms for increased SOB/ edema/ acute weight gain and call us with these changes. He voices understanding. I will otherwise cancel further ICM follow up with him per his request.

## 2014-07-05 NOTE — Telephone Encounter (Signed)
New message     Talk to [facility] .  Please explain to pt why it is important to check for fluid around his pacemaker.  He received a bill for 90.00.  He said he is not going to do that any more.  Please call

## 2014-08-13 ENCOUNTER — Ambulatory Visit (INDEPENDENT_AMBULATORY_CARE_PROVIDER_SITE_OTHER): Payer: Medicare Other | Admitting: *Deleted

## 2014-08-13 ENCOUNTER — Telehealth: Payer: Self-pay | Admitting: Cardiology

## 2014-08-13 DIAGNOSIS — I472 Ventricular tachycardia: Secondary | ICD-10-CM

## 2014-08-13 DIAGNOSIS — I5022 Chronic systolic (congestive) heart failure: Secondary | ICD-10-CM

## 2014-08-13 DIAGNOSIS — I4729 Other ventricular tachycardia: Secondary | ICD-10-CM

## 2014-08-13 LAB — MDC_IDC_ENUM_SESS_TYPE_INCLINIC
Brady Statistic RA Percent Paced: 8.2 %
Brady Statistic RV Percent Paced: 99 %
HighPow Impedance: 48 Ohm
Implantable Pulse Generator Serial Number: 643216
Lead Channel Impedance Value: 540 Ohm
Lead Channel Impedance Value: 630 Ohm
Lead Channel Sensing Intrinsic Amplitude: 12 mV
Lead Channel Setting Pacing Amplitude: 2.5 V
Lead Channel Setting Pacing Pulse Width: 0.5 ms
Lead Channel Setting Sensing Sensitivity: 2 mV
MDC IDC MSMT LEADCHNL LV IMPEDANCE VALUE: 410 Ohm
MDC IDC SET LEADCHNL LV PACING AMPLITUDE: 2 V
MDC IDC SET LEADCHNL LV PACING PULSEWIDTH: 0.5 ms
MDC IDC SET LEADCHNL RA PACING AMPLITUDE: 2 V
MDC IDC SET ZONE DETECTION INTERVAL: 300 ms
Zone Setting Detection Interval: 340 ms

## 2014-08-13 NOTE — Telephone Encounter (Signed)
LMOVM reminding pt to send remote transmission.   

## 2014-08-14 NOTE — Progress Notes (Signed)
Remote ICD transmission.   

## 2014-08-20 ENCOUNTER — Encounter: Payer: Self-pay | Admitting: Cardiology

## 2014-08-22 ENCOUNTER — Encounter: Payer: Self-pay | Admitting: Internal Medicine

## 2014-11-13 ENCOUNTER — Encounter: Payer: Self-pay | Admitting: Internal Medicine

## 2014-11-13 ENCOUNTER — Ambulatory Visit (INDEPENDENT_AMBULATORY_CARE_PROVIDER_SITE_OTHER): Payer: Medicare Other | Admitting: *Deleted

## 2014-11-13 DIAGNOSIS — I472 Ventricular tachycardia: Secondary | ICD-10-CM | POA: Diagnosis not present

## 2014-11-13 DIAGNOSIS — I4729 Other ventricular tachycardia: Secondary | ICD-10-CM

## 2014-11-13 NOTE — Progress Notes (Signed)
Remote ICD transmission.   

## 2014-11-15 LAB — CUP PACEART REMOTE DEVICE CHECK
Battery Remaining Percentage: 48 %
Battery Voltage: 2.92 V
Brady Statistic AP VS Percent: 1 %
Brady Statistic AS VP Percent: 90 %
Brady Statistic AS VS Percent: 1 %
Brady Statistic RA Percent Paced: 8.7 %
Date Time Interrogation Session: 20160712060807
HIGH POWER IMPEDANCE MEASURED VALUE: 53 Ohm
Lead Channel Impedance Value: 450 Ohm
Lead Channel Impedance Value: 560 Ohm
Lead Channel Sensing Intrinsic Amplitude: 2.9 mV
Lead Channel Setting Pacing Amplitude: 2 V
Lead Channel Setting Pacing Pulse Width: 0.5 ms
Lead Channel Setting Sensing Sensitivity: 2 mV
MDC IDC MSMT BATTERY REMAINING LONGEVITY: 41 mo
MDC IDC MSMT LEADCHNL RV IMPEDANCE VALUE: 560 Ohm
MDC IDC MSMT LEADCHNL RV SENSING INTR AMPL: 12 mV
MDC IDC PG SERIAL: 643216
MDC IDC SET LEADCHNL LV PACING PULSEWIDTH: 0.5 ms
MDC IDC SET LEADCHNL RA PACING AMPLITUDE: 2 V
MDC IDC SET LEADCHNL RV PACING AMPLITUDE: 2.5 V
MDC IDC SET ZONE DETECTION INTERVAL: 300 ms
MDC IDC STAT BRADY AP VP PERCENT: 9.1 %
Zone Setting Detection Interval: 340 ms

## 2014-12-07 ENCOUNTER — Encounter: Payer: Self-pay | Admitting: Cardiology

## 2015-03-08 ENCOUNTER — Encounter: Payer: Self-pay | Admitting: Internal Medicine

## 2015-03-08 ENCOUNTER — Ambulatory Visit (INDEPENDENT_AMBULATORY_CARE_PROVIDER_SITE_OTHER): Payer: Medicare Other | Admitting: Internal Medicine

## 2015-03-08 VITALS — BP 138/78 | HR 65 | Ht 70.0 in | Wt 172.0 lb

## 2015-03-08 DIAGNOSIS — I429 Cardiomyopathy, unspecified: Secondary | ICD-10-CM

## 2015-03-08 DIAGNOSIS — I5022 Chronic systolic (congestive) heart failure: Secondary | ICD-10-CM

## 2015-03-08 DIAGNOSIS — I442 Atrioventricular block, complete: Secondary | ICD-10-CM

## 2015-03-08 DIAGNOSIS — I4729 Other ventricular tachycardia: Secondary | ICD-10-CM

## 2015-03-08 DIAGNOSIS — I480 Paroxysmal atrial fibrillation: Secondary | ICD-10-CM | POA: Diagnosis not present

## 2015-03-08 DIAGNOSIS — I472 Ventricular tachycardia: Secondary | ICD-10-CM

## 2015-03-08 NOTE — Patient Instructions (Addendum)
Your physician has recommended you make the following change in your medication:  Stop warfarin. Continue all other medications the same. Remote device check 06/09/14. Your physician recommends that you schedule a follow-up appointment in: 1 year. You can schedule this appointment today or you can wait for your letter to come in the mail in about 10 months reminding you to call and schedule this appointment. If you do not receive this letter, please contact our office for your appointment. You are being enrolled in the [facility] clinic. You will be contacted about this.s

## 2015-03-10 NOTE — Progress Notes (Signed)
PCP: Clinton Quant, MD   George Medina is a 81 y.o. male who presents today for routine electrophysiology followup.  Since last being seen in our clinic, the patient reports doing very well. HIs concern today is with coumadin.  He wishes to stop this medicines.  His SOB has returned to baseline and is much improved. Today, he denies symptoms of palpitations, chest pain,  lower extremity edema, dizziness, presyncope, syncope, or ICD shocks.  The patient is otherwise without complaint today.   Past Medical History  Diagnosis Date  . Nonischemic cardiomyopathy ([place])   . Nonsustained ventricular tachycardia ([place])   . CHF (congestive heart failure) ([facility])     chronic-systolic now class I-II  . A-fib ([place])     anticoagulated with coumadin  . Complete heart block ([place])   . Hypertension   . Diabetes mellitus   . Brain aneurysm 1994    required surgery x2   Past Surgical History  Procedure Laterality Date  . Cardiac defibrillator placement      most recent gen change 5/12,  he has a SJM 7001 lead which was fluroscopically normal during generator change 5/12.  LV and RV leads were switched in the header  . Cerebral aneurysm repair      x2    Current Outpatient Prescriptions  Medication Sig Dispense Refill  . albuterol (PROVENTIL HFA;VENTOLIN HFA) 108 (90 BASE) MCG/ACT inhaler Inhale 2 puffs into the lungs every 6 (six) hours as needed.      Marland Kitchen albuterol (PROVENTIL) (2.5 MG/3ML) 0.083% nebulizer solution Take 3 mLs by nebulization Once daily as needed.    . carvedilol (COREG) 25 MG tablet Take 25 mg by mouth 2 (two) times daily with a meal.      . cholecalciferol (VITAMIN D) 1000 UNITS tablet Take 1,000 Units by mouth daily.      . finasteride (PROSCAR) 5 MG tablet Take 5 mg by mouth daily.     . fluticasone (FLONASE) 50 MCG/ACT nasal spray Place 1 spray into the nose as needed.     . furosemide (LASIX) 20 MG tablet Take 20 mg by mouth as needed.     Marland Kitchen glipiZIDE (GLUCOTROL XL) 2.5 MG  24 hr tablet Take 2.5 mg by mouth daily.      . metFORMIN (GLUCOPHAGE) 1000 MG tablet Take 1,000 mg by mouth 2 (two) times daily with a meal.     . NEXIUM 40 MG capsule Take 40 mg by mouth daily before breakfast.     . ramipril (ALTACE) 10 MG capsule Take 10 mg by mouth daily.      . sitaGLIPtin (JANUVIA) 100 MG tablet Take 100 mg by mouth daily.    . SYMBICORT 160-4.5 MCG/ACT inhaler Inhale 2 puffs into the lungs 2 (two) times daily.     Marland Kitchen VYTORIN 10-40 MG per tablet Take 0.5 tablets by mouth Daily.     No current facility-administered medications for this visit.    Physical Exam: Filed Vitals:   03/08/15 1137  BP: 138/78  Pulse: 65  Height: 5\' 10"  (1.778 m)  Weight: 172 lb (78.019 kg)  SpO2: 91%    GEN- The patient is well appearing, alert and oriented x 3 today.   Head- normocephalic, atraumatic Eyes-  Sclera clear, conjunctiva pink Ears- hearing intact Oropharynx- clear Lungs- coarse rhonchii at the left base, normal work of breathing Chest- ICD pocket is well healed Heart- Regular rate and rhythm, no murmurs, rubs or gallops, PMI not laterally displaced GI-  soft, NT, ND, + BS Extremities- no clubbing, cyanosis, or edema  ICD interrogation- reviewed in detail today,  See PACEART report  Assessment and Plan:  1. Chronic systolic dysfunction euvolemic today No changes Will enroll in ICM device clinic with Sharman Cheek.  2. Complete heart block Normal BiV ICD function See Pace Art report No changes today I have discussed SJM Fortify Assura advisary with the patient today. He understands that recommendation from SJM is to not replace the device at this time. The patient is device dependant.  The patient has had appropriate device therapy in the past for VT.  Vibratory alert demonstrated today.  He is actively remotely monitored and understands the importance of compliance today. We discussed prophylactic device generator change today as an option.  He is clear in his  decision to avoid this. Sharman Cheek will follow montly in ICM device clinic.  3. Atrial fibrillation I have reviewed 2 years worth of device transmission and in that time frame he has had no afib.  I think that it would be reasonable per his request and his advanced age to stop coumadin at this time and resume anticoagulation in the future should he have any additional atrial fibrillation by remote transmissions.  He would like for me to discuss this with Dr Harmon Pier.  I have called his office (closed today) and felt a message for him to call me early next week).    4. HTN Stable No change required today     5. VT Well controlled   No arrhythmias in a year No changes Normal ICD function     Merlin Return in 1 year  Jeneen Rinks Nicolaos Mitrano,MD

## 2015-03-11 LAB — CUP PACEART INCLINIC DEVICE CHECK
HIGH POWER IMPEDANCE MEASURED VALUE: 50 Ohm
Implantable Lead Implant Date: 20070102
Implantable Lead Implant Date: 20070102
Implantable Lead Implant Date: 20070102
Implantable Lead Location: 753858
Implantable Lead Location: 753860
Implantable Lead Model: 4194
Implantable Lead Model: 5076
Implantable Lead Model: 7001
Lead Channel Impedance Value: 437.5 Ohm
Lead Channel Impedance Value: 512.5 Ohm
Lead Channel Impedance Value: 625 Ohm
Lead Channel Pacing Threshold Amplitude: 0.5 V
Lead Channel Pacing Threshold Amplitude: 0.75 V
Lead Channel Pacing Threshold Amplitude: 0.75 V
Lead Channel Pacing Threshold Pulse Width: 0.5 ms
Lead Channel Pacing Threshold Pulse Width: 0.5 ms
Lead Channel Pacing Threshold Pulse Width: 0.5 ms
Lead Channel Pacing Threshold Pulse Width: 0.5 ms
Lead Channel Pacing Threshold Pulse Width: 0.5 ms
Lead Channel Setting Pacing Amplitude: 2 V
Lead Channel Setting Pacing Amplitude: 2.5 V
Lead Channel Setting Pacing Pulse Width: 0.5 ms
Lead Channel Setting Sensing Sensitivity: 2 mV
MDC IDC LEAD LOCATION: 753859
MDC IDC MSMT BATTERY REMAINING LONGEVITY: 37.2
MDC IDC MSMT LEADCHNL LV PACING THRESHOLD AMPLITUDE: 0.5 V
MDC IDC MSMT LEADCHNL RA SENSING INTR AMPL: 2.8 mV
MDC IDC MSMT LEADCHNL RV PACING THRESHOLD AMPLITUDE: 1 V
MDC IDC MSMT LEADCHNL RV PACING THRESHOLD AMPLITUDE: 1 V
MDC IDC MSMT LEADCHNL RV PACING THRESHOLD PULSEWIDTH: 0.5 ms
MDC IDC SESS DTM: 20161104155040
MDC IDC SET LEADCHNL LV PACING AMPLITUDE: 2 V
MDC IDC SET LEADCHNL LV PACING PULSEWIDTH: 0.5 ms
MDC IDC STAT BRADY RA PERCENT PACED: 7.5 %
MDC IDC STAT BRADY RV PERCENT PACED: 99 %
Pulse Gen Serial Number: 643216

## 2015-03-12 ENCOUNTER — Telehealth: Payer: Self-pay | Admitting: *Deleted

## 2015-03-12 NOTE — Telephone Encounter (Signed)
Spoke with staff at PCP office letting them know Dr. Jackalyn Lombard recommendations for patient coming off of warfarin. George Medina confirmed that the office note was received with this recommendation and that Dr. Harmon Pier was aware of this. Per George Medina, patient is coming to their office for an office visit to discuss coming off warfarin.

## 2015-03-19 ENCOUNTER — Encounter: Payer: Self-pay | Admitting: Internal Medicine

## 2015-04-16 ENCOUNTER — Telehealth: Payer: Self-pay

## 2015-04-16 NOTE — Telephone Encounter (Signed)
Patient referred to [facility] clinic by Dr Rayann Heman.  Call to patient and ICM intro given and he agreed to [facility] monthly follow up.  1st ICM remote transmission scheduled for 05/07/2015.  Provided phone number and encouraged to call should he experience any HF symptoms.  He denied any symptoms at this time.  Patient letter sent with transmission date.

## 2015-05-07 ENCOUNTER — Ambulatory Visit (INDEPENDENT_AMBULATORY_CARE_PROVIDER_SITE_OTHER): Payer: Medicare Other

## 2015-05-07 DIAGNOSIS — I5022 Chronic systolic (congestive) heart failure: Secondary | ICD-10-CM | POA: Diagnosis not present

## 2015-05-07 DIAGNOSIS — Z95810 Presence of automatic (implantable) cardiac defibrillator: Secondary | ICD-10-CM

## 2015-05-08 ENCOUNTER — Telehealth: Payer: Self-pay

## 2015-05-08 NOTE — Telephone Encounter (Signed)
Spoke with patient.

## 2015-05-08 NOTE — Telephone Encounter (Signed)
ICM transmission received.  Attempted call to patient and left message for return call. 

## 2015-05-08 NOTE — Progress Notes (Addendum)
EPIC Encounter for ICM Monitoring  Patient Name: George Medina is a 82 y.o. male Date: 05/08/2015 Primary Care Physican: Clinton Quant, MD Primary Cardiologist: Sabra Heck ([facility]) Electrophysiologist: Allred Dry Weight: 177 lbs      In the past month, have you:  1. Gained more than 2 pounds in a day or more than 5 pounds in a week? no  2. Had changes in your medications (with verification of current medications)? no  3. Had more shortness of breath than is usual for you? no  4. Limited your activity because of shortness of breath? no  5. Not been able to sleep because of shortness of breath? no  6. Had increased swelling in your feet or ankles? no  7. Had symptoms of dehydration (dizziness, dry mouth, increased thirst, decreased urine output) no  8. Had changes in sodium restriction? no  9. Been compliant with medication? Yes   ICM trend: 05/07/2015 - 3 month view  ICM Trend: 1 year view 05/07/2015    Follow-up plan: ICM clinic phone appointment on 06/10/2015.  Corvue impedance below baseline 03/14/2015 to 03/25/2015 and 04/10/2015 to 04/20/2015 suggesting fluid retention.  He denied any HF symptoms and stated he has been eating holiday foods so that may have caused some fluid.  He reported he thought he had gained weight but todays weight is 177 lbs which is not an increase for him.  He has an appointment with PCP 05/22/2015.  He reported he takes Furosemide if needed but has not needed one recently.  Reviewed HF symptoms to report.   No changes today.  Copy of note sent to patient's primary care physician, primary cardiologist, and device following physician.  [name], RN, CCM 05/08/2015 1:42 PM

## 2015-05-10 NOTE — Progress Notes (Addendum)
   Call to Dr Ammie Ferrier office at [facility], [phone].  Requested copy of last echo.  Person answering phone stated the last Echo was completed 11/2013 and would fax the report.  Received faxed echo report and latest EF 50-55%.  Will have report scanned in medical records.     Call to patient to inform of Dr Jackalyn Lombard recommendation and he reported his cardiologist in [place], [place], Dr Orpah Greek has completed an echo in the last couple of years.  He reported he is scheduled for another echo in July 2017.  I stated I would call Dr Sanjuan Dame office to get the latest results of echo.    Thompson Grayer, MD   Sent: Fri May 10, 2015 1:02 PM    To: [name], RN        Message     Please obtain an echo    If EF is reduced, we should refer to general cardiology in [place] for medicine optimization.

## 2015-05-13 NOTE — Progress Notes (Signed)
Call back to patient and advised Dr Rayann Heman reviewed latest echo from 2015 and spoke with Dr Sabra Heck today to provide update on fluid readings.  Advised no changes today and no echo needs to be scheduled at this time since he will have another one in July that was ordered by Dr Sabra Heck.   Reviewed 2015 Echo report from Dr Sanjuan Dame office with Dr Rayann Heman in office.  Dr Rayann Heman contacted Dr Sabra Heck and discussed current Corvue readings.  Dr Rayann Heman stated no need to schedule an echo at this time since patient has one ordered by Dr Sabra Heck in July 2017.

## 2015-06-10 ENCOUNTER — Ambulatory Visit (INDEPENDENT_AMBULATORY_CARE_PROVIDER_SITE_OTHER): Payer: Medicare Other | Admitting: *Deleted

## 2015-06-10 DIAGNOSIS — I472 Ventricular tachycardia: Secondary | ICD-10-CM | POA: Diagnosis not present

## 2015-06-10 DIAGNOSIS — I5022 Chronic systolic (congestive) heart failure: Secondary | ICD-10-CM | POA: Diagnosis not present

## 2015-06-10 DIAGNOSIS — I429 Cardiomyopathy, unspecified: Secondary | ICD-10-CM

## 2015-06-10 DIAGNOSIS — Z95810 Presence of automatic (implantable) cardiac defibrillator: Secondary | ICD-10-CM

## 2015-06-10 DIAGNOSIS — I4729 Other ventricular tachycardia: Secondary | ICD-10-CM

## 2015-06-10 NOTE — Progress Notes (Signed)
EPIC Encounter for ICM Monitoring  Patient Name: George Medina is a 82 y.o. male Date: 06/10/2015 Primary Care Physican: Clinton Quant, MD Primary Cardiologist: Sabra Heck ([facility]) Electrophysiologist: Allred Dry Weight: 173 lbs  Bi-V Pacing 97%       In the past month, have you:  1. Gained more than 2 pounds in a day or more than 5 pounds in a week? no  2. Had changes in your medications (with verification of current medications)? no  3. Had more shortness of breath than is usual for you? no  4. Limited your activity because of shortness of breath? no  5. Not been able to sleep because of shortness of breath? no  6. Had increased swelling in your feet or ankles? He has in the last month and was taking Furosemide daily which resolved the swelling.   7. Had symptoms of dehydration (dizziness, dry mouth, increased thirst, decreased urine output) no  8. Had changes in sodium restriction? no  9. Been compliant with medication? Patient has been taking Furosemide 20 mg daily instead of prn due to leg swelling.  He stated he does not think he needs it daily.  Explained the prescription is prn and he should take the medication for symptoms. Advised to take when he has fluid retention symptoms such as 2-3 lb overnight weight gain or 5 lbs in a week, increase SOB and/or leg swelling.    ICM trend: 3 month view for 06/10/2015  ICM trend: 1 year view for 06/10/2015   Follow-up plan: ICM clinic phone appointment on 07/16/2015.  Corvue thoracic impedance above reference line from 05/23/2015 to 06/04/2015 suggesting dryness.  Patient reported taking Furosemide daily instead of prn which may correlate with the dryness.  Thoracic impedance below reference line from 06/06/2015 to 06/09/2015 and denied any fluid retention symptoms.  Encouraged to follow low salt diet and fluid limit of 64 oz daily.  No changes today.  Encouraged to call if he has fluid symptoms.   Copy of note  sent to patient's primary care physician, primary cardiologist, and device following physician.  [name], RN, CCM 06/10/2015 12:17 PM

## 2015-06-11 NOTE — Progress Notes (Signed)
Remote ICD transmission.   

## 2015-07-12 LAB — CUP PACEART REMOTE DEVICE CHECK
Battery Remaining Longevity: 35 mo
Battery Voltage: 2.89 V
Brady Statistic AP VS Percent: 1 %
Brady Statistic AS VP Percent: 94 %
Brady Statistic AS VS Percent: 1.5 %
Date Time Interrogation Session: 20170206112531
HIGH POWER IMPEDANCE MEASURED VALUE: 56 Ohm
Implantable Lead Implant Date: 20070102
Implantable Lead Implant Date: 20070102
Implantable Lead Location: 753859
Implantable Lead Model: 4194
Implantable Lead Model: 7001
Lead Channel Impedance Value: 540 Ohm
Lead Channel Pacing Threshold Amplitude: 0.5 V
Lead Channel Pacing Threshold Amplitude: 0.75 V
Lead Channel Pacing Threshold Amplitude: 1 V
Lead Channel Sensing Intrinsic Amplitude: 12 mV
Lead Channel Sensing Intrinsic Amplitude: 4.7 mV
Lead Channel Setting Pacing Amplitude: 2 V
Lead Channel Setting Pacing Amplitude: 2.5 V
Lead Channel Setting Pacing Pulse Width: 0.5 ms
Lead Channel Setting Pacing Pulse Width: 0.5 ms
Lead Channel Setting Sensing Sensitivity: 2 mV
MDC IDC LEAD IMPLANT DT: 20070102
MDC IDC LEAD LOCATION: 753858
MDC IDC LEAD LOCATION: 753860
MDC IDC MSMT BATTERY REMAINING PERCENTAGE: 42 %
MDC IDC MSMT LEADCHNL LV IMPEDANCE VALUE: 450 Ohm
MDC IDC MSMT LEADCHNL LV PACING THRESHOLD PULSEWIDTH: 0.5 ms
MDC IDC MSMT LEADCHNL RA PACING THRESHOLD PULSEWIDTH: 0.5 ms
MDC IDC MSMT LEADCHNL RV IMPEDANCE VALUE: 550 Ohm
MDC IDC MSMT LEADCHNL RV PACING THRESHOLD PULSEWIDTH: 0.5 ms
MDC IDC SET LEADCHNL RA PACING AMPLITUDE: 2 V
MDC IDC STAT BRADY AP VP PERCENT: 4 %
MDC IDC STAT BRADY RA PERCENT PACED: 3 %
Pulse Gen Serial Number: 643216

## 2015-07-16 ENCOUNTER — Telehealth: Payer: Self-pay | Admitting: Cardiology

## 2015-07-16 NOTE — Telephone Encounter (Signed)
LMOVM reminding pt to send remote transmission.   

## 2015-07-17 ENCOUNTER — Encounter: Payer: Self-pay | Admitting: Cardiology

## 2015-07-17 NOTE — Progress Notes (Signed)
No remote ICM transmission received.  Next remote transmission scheduled for 08/07/2015.

## 2015-07-18 ENCOUNTER — Telehealth: Payer: Self-pay | Admitting: Internal Medicine

## 2015-07-18 ENCOUNTER — Ambulatory Visit (INDEPENDENT_AMBULATORY_CARE_PROVIDER_SITE_OTHER): Payer: Medicare Other

## 2015-07-18 DIAGNOSIS — I5022 Chronic systolic (congestive) heart failure: Secondary | ICD-10-CM | POA: Diagnosis not present

## 2015-07-18 DIAGNOSIS — Z95810 Presence of automatic (implantable) cardiac defibrillator: Secondary | ICD-10-CM | POA: Diagnosis not present

## 2015-07-18 NOTE — Progress Notes (Signed)
EPIC Encounter for ICM Monitoring  Patient Name: George Medina is a 82 y.o. male Date: 07/18/2015 Primary Care Physican: Clinton Quant, MD Primary Cardiologist: Sabra Heck ([facility]) Electrophysiologist: Allred Dry Weight: 176  lbs   Bi-V Pacing 97%      In the past month, have you:  1. Gained more than 2 pounds in a day or more than 5 pounds in a week? no  2. Had changes in your medications (with verification of current medications)? Yes, Dr Sabra Heck ordered to take Furosemide 20 mg daily and take extra tablet when needed.    3. Had more shortness of breath than is usual for you? no  4. Limited your activity because of shortness of breath? no  5. Not been able to sleep because of shortness of breath? no  6. Had increased swelling in your feet or ankles? no  7. Had symptoms of dehydration (dizziness, dry mouth, increased thirst, decreased urine output) no  8. Had changes in sodium restriction? no  9. Been compliant with medication? No, has missed several days of taking Furosemide due to being at his brother's funeral   ICM trend: 3 month view for 07/18/2015   ICM trend: 1 year view for 07/18/2015   Follow-up plan: ICM clinic phone appointment on 08/21/2015.  Thoracic impedance below reference line starting 07/16/2015 suggesting fluid accumulation.  Patient denied fluid symptoms. He reported he has missed several days of Furosemide due to his brother's funeral.  He stated he resumed his regular Furosemide dosage today.  Advised the fluid accumulation is probably due to missing Furosemide dosages in the last few days and be sure to continue with daily doses as ordered which should stabilize fluid levels.  Education given to limit sodium intake to < 2000 mg and fluid intake to 64 oz daily.  Encouraged to call for any fluid symptoms.  No changes today.    [name], RN, CCM 07/18/2015 11:20 AM

## 2015-07-18 NOTE — Addendum Note (Signed)
Addended by: [name] on: 07/18/2015 11:52 AM   Modules accepted: Medications

## 2015-07-18 NOTE — Telephone Encounter (Signed)
NEw MEssage  Pt requested to speak w/ Margarita Grizzle concerning remote check. Please call back and discuss.

## 2015-07-18 NOTE — Telephone Encounter (Signed)
Spoke with patient.

## 2015-08-21 ENCOUNTER — Ambulatory Visit (INDEPENDENT_AMBULATORY_CARE_PROVIDER_SITE_OTHER): Payer: Medicare Other

## 2015-08-21 DIAGNOSIS — Z95810 Presence of automatic (implantable) cardiac defibrillator: Secondary | ICD-10-CM | POA: Diagnosis not present

## 2015-08-21 DIAGNOSIS — I5022 Chronic systolic (congestive) heart failure: Secondary | ICD-10-CM

## 2015-08-21 NOTE — Progress Notes (Signed)
EPIC Encounter for ICM Monitoring  Patient Name: George Medina is a 82 y.o. male Date: 08/21/2015 Primary Care Physican: Clinton Quant, MD Primary Cardiologist: Sabra Heck ([facility]) Electrophysiologist: Allred Dry Weight: 174 lbs   Bi-V Pacing 97%      In the past month, have you:  1. Gained more than 2 pounds in a day or more than 5 pounds in a week? no  2. Had changes in your medications (with verification of current medications)? no  3. Had more shortness of breath than is usual for you? no  4. Limited your activity because of shortness of breath? no  5. Not been able to sleep because of shortness of breath? no  6. Had increased swelling in your feet or ankles? no  7. Had symptoms of dehydration (dizziness, dry mouth, increased thirst, decreased urine output) no  8. Had changes in sodium restriction? no  9. Been compliant with medication? Yes   ICM trend: 3 month view for 08/21/2015   ICM trend: 1 year view for 08/21/2015   Follow-up plan: ICM clinic phone appointment on 09/23/2015.  Thoracic impedance below reference line from 08/12/2015 to 08/15/2015 suggesting fluid accumulation and returned to reference line on 08/16/2015.  Patient denied fluid symptoms such as weight gain, SOB and/or lower extremity swelling.  Reminded to limit sodium intake to < 2000 mg and fluid intake to 64 oz daily.  Encouraged to call for any fluid symptoms.  No changes today.    [name], RN, CCM 08/21/2015 1:29 PM

## 2015-09-09 ENCOUNTER — Telehealth: Payer: Self-pay | Admitting: Cardiology

## 2015-09-09 ENCOUNTER — Ambulatory Visit (INDEPENDENT_AMBULATORY_CARE_PROVIDER_SITE_OTHER): Payer: Medicare Other | Admitting: *Deleted

## 2015-09-09 DIAGNOSIS — I472 Ventricular tachycardia: Secondary | ICD-10-CM | POA: Diagnosis not present

## 2015-09-09 DIAGNOSIS — Z95810 Presence of automatic (implantable) cardiac defibrillator: Secondary | ICD-10-CM

## 2015-09-09 DIAGNOSIS — I4729 Other ventricular tachycardia: Secondary | ICD-10-CM

## 2015-09-09 NOTE — Telephone Encounter (Signed)
Spoke with pt and reminded pt of remote transmission that is due today. Pt verbalized understanding.   

## 2015-09-10 NOTE — Progress Notes (Signed)
Remote ICD transmission.   

## 2015-09-23 ENCOUNTER — Telehealth: Payer: Self-pay | Admitting: Cardiology

## 2015-09-23 ENCOUNTER — Ambulatory Visit (INDEPENDENT_AMBULATORY_CARE_PROVIDER_SITE_OTHER): Payer: Medicare Other

## 2015-09-23 DIAGNOSIS — I5022 Chronic systolic (congestive) heart failure: Secondary | ICD-10-CM | POA: Diagnosis not present

## 2015-09-23 DIAGNOSIS — Z95810 Presence of automatic (implantable) cardiac defibrillator: Secondary | ICD-10-CM

## 2015-09-23 NOTE — Telephone Encounter (Signed)
Spoke with pt and reminded pt of remote transmission that is due today. Pt verbalized understanding.   

## 2015-09-25 NOTE — Progress Notes (Signed)
EPIC Encounter for ICM Monitoring  Patient Name: George Medina is a 82 y.o. male Date: 09/25/2015 Primary Care Physican: Clinton Quant, MD Primary Cardiologist: Sabra Heck ([facility]) Electrophysiologist: Allred Dry Weight: 170 lbs   Bi-V Pacing 98%      In the past month, have you:  1. Gained more than 2 pounds in a day or more than 5 pounds in a week? No  2. Had changes in your medications (with verification of current medications)? No  3. Had more shortness of breath than is usual for you? No  4. Limited your activity because of shortness of breath? No  5. Not been able to sleep because of shortness of breath? No  6. Had increased swelling in your feet or ankles? No  7. Had symptoms of dehydration (dizziness, dry mouth, increased thirst, decreased urine output) No  8. Had changes in sodium restriction? No  9. Been compliant with medication? No   ICM trend: 3 month view for 09/23/2015   ICM trend: 1 year view for 09/23/2015   Follow-up plan: ICM clinic phone appointment on 10/29/2015.   Thoracic impedance below reference line from 08/26/2015 to 08/30/2015, 09/16/2015 to 09/19/2015 suggesting fluid accumulation and returned to baseline 09/19/2015.   He denied any fluid symptoms during time of decreased thoracic impedance.  He reported he feels well and had annual physical on 09/24/2015.  Encouraged him to call for any fluid symptoms.   [name], RN, CCM 09/25/2015 10:28 AM

## 2015-10-16 ENCOUNTER — Encounter: Payer: Self-pay | Admitting: Cardiology

## 2015-10-16 LAB — CUP PACEART REMOTE DEVICE CHECK
Battery Remaining Longevity: 34 mo
Battery Remaining Percentage: 39 %
Battery Voltage: 2.89 V
Brady Statistic AP VS Percent: 1 %
Brady Statistic AS VP Percent: 93 %
Brady Statistic RA Percent Paced: 3.5 %
Date Time Interrogation Session: 20170508182125
HighPow Impedance: 50 Ohm
Implantable Lead Implant Date: 20070102
Implantable Lead Implant Date: 20070102
Implantable Lead Location: 753858
Implantable Lead Location: 753859
Implantable Lead Location: 753860
Implantable Lead Model: 4194
Implantable Lead Model: 5076
Implantable Lead Model: 7001
Lead Channel Impedance Value: 440 Ohm
Lead Channel Impedance Value: 450 Ohm
Lead Channel Impedance Value: 650 Ohm
Lead Channel Pacing Threshold Pulse Width: 0.5 ms
Lead Channel Sensing Intrinsic Amplitude: 11.8 mV
Lead Channel Setting Pacing Amplitude: 2 V
Lead Channel Setting Pacing Amplitude: 2 V
Lead Channel Setting Pacing Amplitude: 2.5 V
MDC IDC LEAD IMPLANT DT: 20070102
MDC IDC MSMT LEADCHNL LV PACING THRESHOLD AMPLITUDE: 0.5 V
MDC IDC MSMT LEADCHNL LV PACING THRESHOLD PULSEWIDTH: 0.5 ms
MDC IDC MSMT LEADCHNL RA PACING THRESHOLD AMPLITUDE: 0.75 V
MDC IDC MSMT LEADCHNL RA SENSING INTR AMPL: 2.8 mV
MDC IDC MSMT LEADCHNL RV PACING THRESHOLD AMPLITUDE: 1 V
MDC IDC MSMT LEADCHNL RV PACING THRESHOLD PULSEWIDTH: 0.5 ms
MDC IDC SET LEADCHNL LV PACING PULSEWIDTH: 0.5 ms
MDC IDC SET LEADCHNL RV PACING PULSEWIDTH: 0.5 ms
MDC IDC SET LEADCHNL RV SENSING SENSITIVITY: 2 mV
MDC IDC STAT BRADY AP VP PERCENT: 4.2 %
MDC IDC STAT BRADY AS VS PERCENT: 1.6 %
Pulse Gen Serial Number: 643216

## 2015-10-29 ENCOUNTER — Ambulatory Visit (INDEPENDENT_AMBULATORY_CARE_PROVIDER_SITE_OTHER): Payer: Medicare Other

## 2015-10-29 DIAGNOSIS — Z95810 Presence of automatic (implantable) cardiac defibrillator: Secondary | ICD-10-CM | POA: Diagnosis not present

## 2015-10-29 DIAGNOSIS — I5022 Chronic systolic (congestive) heart failure: Secondary | ICD-10-CM | POA: Diagnosis not present

## 2015-10-30 NOTE — Progress Notes (Signed)
EPIC Encounter for ICM Monitoring  Patient Name: George Medina is a 82 y.o. male Date: 10/30/2015 Primary Care Physican: Clinton Quant, MD Primary Cardiologist: Sabra Heck ([facility]) Electrophysiologist: Allred Dry Weight: 172 lbs   Bi-V Pacing 98%      In the past month, have you:  1. Gained more than 2 pounds in a day or more than 5 pounds in a week? No  2. Had changes in your medications (with verification of current medications)? No  3. Had more shortness of breath than is usual for you? No   4. Limited your activity because of shortness of breath? No   5. Not been able to sleep because of shortness of breath? No   6. Had increased swelling in your feet, ankles, legs or stomach area? No   7. Had symptoms of dehydration (dizziness, dry mouth, increased thirst, decreased urine output) No   8. Had changes in sodium restriction? No   9. Been compliant with medication? Yes   ICM trend: 3 month view for 10/29/2015   ICM trend: 1 year view for 10/29/2015   Follow-up plan: ICM clinic phone appointment 12/10/2015.    FLUID LEVELS: Corvue impedance decreased 10/01/2015 to 10/10/2015 and 10/26/2015 to 10/27/2015 suggesting fluid accumulation and back to baseline 10/27/2015 suggesting stable fluid levels.    SYMPTOMS: None, denied any fluid symptoms such as weight gain of 3 pounds overnight or 5 pounds within a week, SOB and/or lower extremity swelling. Encouraged to call for any fluid symptoms.   EDUCATION: Limit sodium intake to < 2000 mg and fluid intake to 64 oz daily.     RECOMMENDATIONS: No changes today.    [name], RN, CCM 10/30/2015 8:32 AM

## 2015-12-09 ENCOUNTER — Ambulatory Visit (INDEPENDENT_AMBULATORY_CARE_PROVIDER_SITE_OTHER): Payer: Medicare Other

## 2015-12-09 ENCOUNTER — Ambulatory Visit (INDEPENDENT_AMBULATORY_CARE_PROVIDER_SITE_OTHER): Payer: Medicare Other | Admitting: *Deleted

## 2015-12-09 ENCOUNTER — Telehealth: Payer: Self-pay

## 2015-12-09 DIAGNOSIS — I5022 Chronic systolic (congestive) heart failure: Secondary | ICD-10-CM | POA: Diagnosis not present

## 2015-12-09 DIAGNOSIS — I4729 Other ventricular tachycardia: Secondary | ICD-10-CM

## 2015-12-09 DIAGNOSIS — I472 Ventricular tachycardia: Secondary | ICD-10-CM

## 2015-12-09 DIAGNOSIS — Z95810 Presence of automatic (implantable) cardiac defibrillator: Secondary | ICD-10-CM

## 2015-12-09 NOTE — Progress Notes (Signed)
EPIC Encounter for ICM Monitoring  Patient Name: George Medina is a 82 y.o. male Date: 12/09/2015 Primary Care Physican: Clinton Quant, MD Primary Cardiologist: Sabra Heck ([facility]) Electrophysiologist: Allred Dry Weight: 174 lb  Bi-V Pacing:  98%       Heart Failure questions reviewed, pt asymptomatic  Thoracic impedance normal.  Impedance trending below baseline starting 12/08/2015 suggesting fluid accumulation.  Recommendations: Take Furosemide 20 mg bid x 2 days and then return to prescribed dosage of 20 mg daily.  Patient reported for the last few days he has eaten a lot of watermelon with salt.  Advised to avoid salt shaker.     ICM trend: 12/09/2015     Follow-up plan: ICM clinic phone appointment on 01/09/2016.  Copy of ICM check sent to device physician.   [name], RN 12/09/2015 11:10 AM

## 2015-12-09 NOTE — Progress Notes (Signed)
Remote ICD transmission.   

## 2015-12-09 NOTE — Telephone Encounter (Signed)
Remote ICM transmission received.  Attempted patient call and left message for return call.   

## 2015-12-11 ENCOUNTER — Encounter: Payer: Self-pay | Admitting: Cardiology

## 2015-12-13 LAB — CUP PACEART REMOTE DEVICE CHECK
Battery Remaining Longevity: 30 mo
Battery Voltage: 2.87 V
Brady Statistic AP VS Percent: 1 %
Brady Statistic AS VP Percent: 94 %
Brady Statistic RA Percent Paced: 3.8 %
Date Time Interrogation Session: 20170807103431
HIGH POWER IMPEDANCE MEASURED VALUE: 50 Ohm
Implantable Lead Implant Date: 20070102
Implantable Lead Implant Date: 20070102
Implantable Lead Location: 753858
Implantable Lead Location: 753859
Implantable Lead Location: 753860
Implantable Lead Model: 5076
Lead Channel Impedance Value: 540 Ohm
Lead Channel Pacing Threshold Amplitude: 0.5 V
Lead Channel Pacing Threshold Amplitude: 1 V
Lead Channel Pacing Threshold Pulse Width: 0.5 ms
Lead Channel Pacing Threshold Pulse Width: 0.5 ms
Lead Channel Setting Pacing Amplitude: 2 V
Lead Channel Setting Pacing Amplitude: 2.5 V
Lead Channel Setting Sensing Sensitivity: 2 mV
MDC IDC LEAD IMPLANT DT: 20070102
MDC IDC LEAD MODEL: 4194
MDC IDC LEAD MODEL: 7001
MDC IDC MSMT BATTERY REMAINING PERCENTAGE: 36 %
MDC IDC MSMT LEADCHNL LV IMPEDANCE VALUE: 430 Ohm
MDC IDC MSMT LEADCHNL RA IMPEDANCE VALUE: 490 Ohm
MDC IDC MSMT LEADCHNL RA PACING THRESHOLD AMPLITUDE: 0.75 V
MDC IDC MSMT LEADCHNL RA PACING THRESHOLD PULSEWIDTH: 0.5 ms
MDC IDC MSMT LEADCHNL RA SENSING INTR AMPL: 3.1 mV
MDC IDC MSMT LEADCHNL RV SENSING INTR AMPL: 12 mV
MDC IDC SET LEADCHNL LV PACING AMPLITUDE: 2 V
MDC IDC SET LEADCHNL LV PACING PULSEWIDTH: 0.5 ms
MDC IDC SET LEADCHNL RV PACING PULSEWIDTH: 0.5 ms
MDC IDC STAT BRADY AP VP PERCENT: 4.5 %
MDC IDC STAT BRADY AS VS PERCENT: 1.4 %
Pulse Gen Serial Number: 643216

## 2016-01-09 ENCOUNTER — Ambulatory Visit (INDEPENDENT_AMBULATORY_CARE_PROVIDER_SITE_OTHER): Payer: Medicare Other

## 2016-01-09 DIAGNOSIS — I5022 Chronic systolic (congestive) heart failure: Secondary | ICD-10-CM

## 2016-01-09 DIAGNOSIS — Z95810 Presence of automatic (implantable) cardiac defibrillator: Secondary | ICD-10-CM | POA: Diagnosis not present

## 2016-01-09 NOTE — Progress Notes (Signed)
EPIC Encounter for ICM Monitoring  Patient Name: George Medina is a 82 y.o. male Date: 01/09/2016 Primary Care Physican: Clinton Quant, MD Primary Cardiologist: Sabra Heck ([facility]) Electrophysiologist: Allred Dry Weight: 174 lb  Bi-V Pacing:  98%       Heart Failure questions reviewed, pt asymptomatic   Thoracic impedance normal suggesting fluid accumulation.  Recommendations: No changes.  Low sodium diet education provided.    Follow-up plan: ICM clinic phone appointment on 02/12/2016.  Copy of ICM check sent to device physician.   ICM trend: 01/07/2016       [name], RN 01/09/2016 3:46 PM

## 2016-02-12 ENCOUNTER — Ambulatory Visit (INDEPENDENT_AMBULATORY_CARE_PROVIDER_SITE_OTHER): Payer: Medicare Other

## 2016-02-12 ENCOUNTER — Telehealth: Payer: Self-pay

## 2016-02-12 ENCOUNTER — Telehealth: Payer: Self-pay | Admitting: Cardiology

## 2016-02-12 DIAGNOSIS — Z95810 Presence of automatic (implantable) cardiac defibrillator: Secondary | ICD-10-CM | POA: Diagnosis not present

## 2016-02-12 DIAGNOSIS — I5022 Chronic systolic (congestive) heart failure: Secondary | ICD-10-CM

## 2016-02-12 NOTE — Telephone Encounter (Signed)
LMOVM reminding pt to send remote transmission.   

## 2016-02-12 NOTE — Telephone Encounter (Signed)
Patient called and stated he did not know how to send remote transmission.  Attempted to assist in sending transmission and unable to send.  Provided [employer] tech support number.

## 2016-02-13 ENCOUNTER — Telehealth: Payer: Self-pay

## 2016-02-13 NOTE — Telephone Encounter (Signed)
Remote ICM transmission received.  Attempted patient call and left message to return call.   

## 2016-02-13 NOTE — Progress Notes (Signed)
EPIC Encounter for ICM Monitoring  Patient Name: George Medina is a 82 y.o. male Date: 02/13/2016 Primary Care Physican: Clinton Quant, MD Primary Cardiologist:Miller ([facility]) Electrophysiologist: Allred Dry Weight:    unknown Bi-V Pacing:  98%            Attempted ICM call and unable to reach.  Transmission reviewed.   Thoracic impedance abnormal suggesting fluid accumulation since 02/03/2016.  Follow-up plan: ICM clinic phone appointment on 02/21/2016 to recheck fluid levels.  Copy of ICM check sent to primary cardiologist and device physician.   ICM trend: 02/12/2016       [name], RN 02/13/2016 2:28 PM

## 2016-02-13 NOTE — Progress Notes (Signed)
Patient returned call.  He denied any fluid symptoms but rattle is audible is over the phone.  He denied any breathing difficulty.  Advised to increase Furosemide 20 mg to bid x 3 days and then return to 1 tablet daily after 3rd day.  Repeat transmission scheduled 02/21/2016.

## 2016-02-21 ENCOUNTER — Ambulatory Visit (INDEPENDENT_AMBULATORY_CARE_PROVIDER_SITE_OTHER): Payer: Medicare Other

## 2016-02-21 ENCOUNTER — Telehealth: Payer: Self-pay

## 2016-02-21 DIAGNOSIS — I5022 Chronic systolic (congestive) heart failure: Secondary | ICD-10-CM

## 2016-02-21 DIAGNOSIS — Z95810 Presence of automatic (implantable) cardiac defibrillator: Secondary | ICD-10-CM

## 2016-02-21 NOTE — Telephone Encounter (Signed)
Remote ICM transmission received.  Attempted patient call and left message to return call.   

## 2016-02-21 NOTE — Progress Notes (Addendum)
EPIC Encounter for ICM Monitoring  Patient Name: George Medina is a 82 y.o. male Date: 02/21/2016 Primary Care Physican: Clinton Quant, MD Primary Cardiologist:Miller ([facility]) Electrophysiologist: Allred Dry Weight:unknown Bi-V Pacing: 98%   Attempted ICM call and unable to reach.   Transmission reviewed.   Thoracic impedance returned to normal after taking increased dosage of Furosemide x 3 days.  Follow-up plan: ICM clinic phone appointment on 04/07/2016.  Office with Dr Rayann Heman on 03/06/2016.  Copy of ICM check sent to primary cardiologist and device physician for updated transmission that impedance returned to normal.   ICM trend: 02/21/2016       [name], RN 02/21/2016 9:37 AM

## 2016-02-21 NOTE — Progress Notes (Signed)
Patient returned call and reviewed transmission, normal fluid levels after increasing Furosemide x 3 days.  Confirmed the office appointment with Dr Rayann Heman on 03/06/2016.  Next ICM remote transmission 04/07/2016.  No changes today.

## 2016-03-06 ENCOUNTER — Encounter: Payer: Self-pay | Admitting: Internal Medicine

## 2016-03-06 ENCOUNTER — Ambulatory Visit (INDEPENDENT_AMBULATORY_CARE_PROVIDER_SITE_OTHER): Payer: Medicare Other | Admitting: Internal Medicine

## 2016-03-06 VITALS — BP 158/66 | HR 60 | Ht 69.0 in | Wt 170.0 lb

## 2016-03-06 DIAGNOSIS — I429 Cardiomyopathy, unspecified: Secondary | ICD-10-CM

## 2016-03-06 DIAGNOSIS — I442 Atrioventricular block, complete: Secondary | ICD-10-CM

## 2016-03-06 DIAGNOSIS — I4729 Other ventricular tachycardia: Secondary | ICD-10-CM

## 2016-03-06 DIAGNOSIS — I5022 Chronic systolic (congestive) heart failure: Secondary | ICD-10-CM | POA: Diagnosis not present

## 2016-03-06 DIAGNOSIS — I472 Ventricular tachycardia: Secondary | ICD-10-CM | POA: Diagnosis not present

## 2016-03-06 DIAGNOSIS — I480 Paroxysmal atrial fibrillation: Secondary | ICD-10-CM

## 2016-03-06 LAB — CUP PACEART INCLINIC DEVICE CHECK
Brady Statistic RV Percent Paced: 98 %
Date Time Interrogation Session: 20171103144725
HIGH POWER IMPEDANCE MEASURED VALUE: 47.3393
Implantable Lead Implant Date: 20070102
Implantable Lead Implant Date: 20070102
Implantable Lead Location: 753858
Implantable Lead Location: 753859
Implantable Lead Model: 4194
Implantable Pulse Generator Implant Date: 20120522
Lead Channel Impedance Value: 425 Ohm
Lead Channel Impedance Value: 562.5 Ohm
Lead Channel Pacing Threshold Amplitude: 0.5 V
Lead Channel Pacing Threshold Amplitude: 0.5 V
Lead Channel Pacing Threshold Amplitude: 1 V
Lead Channel Pacing Threshold Pulse Width: 0.5 ms
Lead Channel Pacing Threshold Pulse Width: 0.5 ms
Lead Channel Pacing Threshold Pulse Width: 0.5 ms
Lead Channel Pacing Threshold Pulse Width: 0.5 ms
Lead Channel Sensing Intrinsic Amplitude: 11.9 mV
Lead Channel Setting Pacing Amplitude: 2 V
Lead Channel Setting Pacing Amplitude: 2.5 V
Lead Channel Setting Pacing Pulse Width: 0.5 ms
Lead Channel Setting Pacing Pulse Width: 0.5 ms
Lead Channel Setting Sensing Sensitivity: 2 mV
MDC IDC LEAD IMPLANT DT: 20070102
MDC IDC LEAD LOCATION: 753860
MDC IDC LEAD MODEL: 7001
MDC IDC MSMT BATTERY REMAINING LONGEVITY: 27 mo
MDC IDC MSMT LEADCHNL LV PACING THRESHOLD AMPLITUDE: 0.5 V
MDC IDC MSMT LEADCHNL RA IMPEDANCE VALUE: 475 Ohm
MDC IDC MSMT LEADCHNL RA PACING THRESHOLD AMPLITUDE: 0.5 V
MDC IDC MSMT LEADCHNL RA SENSING INTR AMPL: 2.7 mV
MDC IDC MSMT LEADCHNL RV PACING THRESHOLD AMPLITUDE: 1 V
MDC IDC MSMT LEADCHNL RV PACING THRESHOLD PULSEWIDTH: 0.5 ms
MDC IDC MSMT LEADCHNL RV PACING THRESHOLD PULSEWIDTH: 0.5 ms
MDC IDC SET LEADCHNL LV PACING AMPLITUDE: 2 V
MDC IDC STAT BRADY RA PERCENT PACED: 3.8 %
Pulse Gen Serial Number: 643216

## 2016-03-06 NOTE — Progress Notes (Signed)
PCP: Clinton Quant, MD   George Medina is a 82 y.o. male who presents today for routine electrophysiology followup.  Since last being seen in our clinic, the patient reports doing very well.  He has rare cough but no SOB.  Today, he denies symptoms of palpitations, chest pain,  lower extremity edema, dizziness, presyncope, syncope, or ICD shocks.  The patient is otherwise without complaint today.   Past Medical History:  Diagnosis Date  . A-fib ([place])    anticoagulated with coumadin  . Brain aneurysm 1994   required surgery x2  . CHF (congestive heart failure) ([facility])    chronic-systolic now class I-II  . Complete heart block ([place])   . Diabetes mellitus   . Hypertension   . Nonischemic cardiomyopathy ([place])   . Nonsustained ventricular tachycardia ([place])    Past Surgical History:  Procedure Laterality Date  . CARDIAC DEFIBRILLATOR PLACEMENT     most recent gen change 5/12,  he has a SJM 7001 lead which was fluroscopically normal during generator change 5/12.  LV and RV leads were switched in the header  . CEREBRAL ANEURYSM REPAIR     x2    Current Outpatient Prescriptions  Medication Sig Dispense Refill  . albuterol (PROVENTIL HFA;VENTOLIN HFA) 108 (90 BASE) MCG/ACT inhaler Inhale 2 puffs into the lungs every 6 (six) hours as needed.      Marland Kitchen albuterol (PROVENTIL) (2.5 MG/3ML) 0.083% nebulizer solution Take 3 mLs by nebulization Once daily as needed.    Marland Kitchen aspirin EC 81 MG tablet Take 81 mg by mouth daily.    . carvedilol (COREG) 25 MG tablet Take 25 mg by mouth 2 (two) times daily with a meal.      . cholecalciferol (VITAMIN D) 1000 UNITS tablet Take 1,000 Units by mouth daily.      . finasteride (PROSCAR) 5 MG tablet Take 5 mg by mouth daily.     . fluticasone (FLONASE) 50 MCG/ACT nasal spray Place 1 spray into the nose as needed.     . furosemide (LASIX) 20 MG tablet Take 20 mg by mouth daily.     Marland Kitchen glipiZIDE (GLUCOTROL XL) 10 MG 24 hr tablet Take 10 mg by mouth daily with  breakfast.    . metFORMIN (GLUCOPHAGE) 1000 MG tablet Take 1,000 mg by mouth 2 (two) times daily with a meal.     . NEXIUM 40 MG capsule Take 40 mg by mouth as needed.     . ramipril (ALTACE) 10 MG capsule Take 10 mg by mouth daily.      . sitaGLIPtin (JANUVIA) 100 MG tablet Take 100 mg by mouth daily.     No current facility-administered medications for this visit.     Physical Exam: Vitals:   03/06/16 1049  BP: (!) 158/66  Pulse: 60  SpO2: 97%  Weight: 170 lb (77.1 kg)  Height: 5\' 9"  (1.753 m)    GEN- The patient is well appearing, alert and oriented x 3 today.   Head- normocephalic, atraumatic Eyes-  Sclera clear, conjunctiva pink Ears- hearing intact Oropharynx- clear Lungs- CTAB, normal work of breathing Chest- ICD pocket is well healed Heart- Regular rate and rhythm, no murmurs, rubs or gallops, PMI not laterally displaced GI- soft, NT, ND, + BS Extremities- no clubbing, cyanosis, or edema  ICD interrogation- reviewed in detail today,  See PACEART report  Assessment and Plan:  1. Chronic systolic dysfunction euvolemic today No changes Followed in ICM device clinic with Sharman Cheek.  2. Complete heart block Normal BiV ICD function See Pace Art report No changes today We reviewed SJM battery advisory again today (see last years note).  Normal device function.  Vibratory alert demonstrated and he knows to call if he has an alert.  Compliant with remote monitoring.  3. Atrial fibrillation I have reviewed 3 years worth of device transmission and in that time frame he has had no afib.   He is very happy to no longer be taking coumadin.  He is aware that if he has further afib that he may require anticoagulation at that time.    4. HTN Stable No change required today     5. VT Well controlled   No arrhythmias in 2 years No changes Normal ICD function     Merlin Return in 1 year  Jeneen Rinks Hayk Divis,MD

## 2016-03-06 NOTE — Patient Instructions (Signed)
Medication Instructions:  none  Labwork: none  Testing/Procedures: none  Follow-Up: Your physician wants you to follow up in:  1 year.  You will receive a reminder letter in the mail one-two months in advance.  If you don't receive a letter, please call our office to schedule the follow up appointment - Dr. Rayann Heman.  Any Other Special Instructions Will Be Listed Below (If Applicable). Remote monitoring is used to monitor your Pacemaker of ICD from home. This monitoring reduces the number of office visits required to check your device to one time per year. It allows [place] to keep an eye on the functioning of your device to ensure it is working properly. You are scheduled for a device check from home on 06/08/2016. You may send your transmission at any time that day. If you have a wireless device, the transmission will be sent automatically. After your physician reviews your transmission, you will receive a postcard with your next transmission date.  If you need a refill on your cardiac medications before your next appointment, please call your pharmacy.

## 2016-04-07 ENCOUNTER — Ambulatory Visit (INDEPENDENT_AMBULATORY_CARE_PROVIDER_SITE_OTHER): Payer: Medicare Other

## 2016-04-07 DIAGNOSIS — I5022 Chronic systolic (congestive) heart failure: Secondary | ICD-10-CM | POA: Diagnosis not present

## 2016-04-07 DIAGNOSIS — Z95810 Presence of automatic (implantable) cardiac defibrillator: Secondary | ICD-10-CM | POA: Diagnosis not present

## 2016-04-07 NOTE — Progress Notes (Signed)
EPIC Encounter for ICM Monitoring  Patient Name: George Medina is a 82 y.o. male Date: 04/07/2016 Primary Care Physican: Clinton Quant, MD Primary Cardiologist:Miller ([facility]) Electrophysiologist: Allred Dry Weight:171 lbs Bi-V Pacing: 96%       Heart Failure questions reviewed, pt asymptomatic   Thoracic impedance returned to normal on 04/01/2016.  Was abnormal suggesting fluid accumulation from 03/27/2016 to 04/01/2016.  Recommendations: No changes.  Reinforced to limit low salt food choices to 2000 mg day and limiting fluid intake to < 2 liters per day. Encouraged to call for fluid symptoms.    Follow-up plan: ICM clinic phone appointment on 05/08/2016.  Copy of ICM check sent to device physician.   ICM trend: 04/07/2016       [name], RN 04/07/2016 3:49 PM

## 2016-05-08 ENCOUNTER — Ambulatory Visit (INDEPENDENT_AMBULATORY_CARE_PROVIDER_SITE_OTHER): Payer: Medicare Other

## 2016-05-08 ENCOUNTER — Telehealth: Payer: Self-pay | Admitting: Cardiology

## 2016-05-08 DIAGNOSIS — Z95810 Presence of automatic (implantable) cardiac defibrillator: Secondary | ICD-10-CM

## 2016-05-08 DIAGNOSIS — I5022 Chronic systolic (congestive) heart failure: Secondary | ICD-10-CM | POA: Diagnosis not present

## 2016-05-08 NOTE — Progress Notes (Signed)
EPIC Encounter for ICM Monitoring  Patient Name: George Medina is a 83 y.o. male Date: 05/08/2016 Primary Care Physican: Clinton Quant, MD Primary Cardiologist:Miller ([facility]) Electrophysiologist: Allred Dry Weight:171 lbs Bi-V Pacing: 95%       Heart Failure questions reviewed, pt asymptomatic.  He thinks the holiday foods caused fluid accumulation   Thoracic impedance abnormal suggesting fluid accumulation but is starting to trend toward baseline today.  Recommendations:  Patient stated Dr Sabra Heck has instructed him to take extra Furosemide when needed.  Advised to take Furosemide 20 mg 1 tablet bid x 2 days and then return to prescribed dosage of 1 tablet daily.  He verbalized understanding.   Follow-up plan: ICM clinic phone appointment on 05/12/2016 to recheck fluid levels.  Copy of ICM check sent to device physician.   3 month ICM trend : 05/08/2016   1 Year ICM trend:      [name], RN 05/08/2016 5:41 PM

## 2016-05-08 NOTE — Telephone Encounter (Signed)
Opened in error

## 2016-05-12 ENCOUNTER — Telehealth: Payer: Self-pay | Admitting: Cardiology

## 2016-05-12 ENCOUNTER — Ambulatory Visit (INDEPENDENT_AMBULATORY_CARE_PROVIDER_SITE_OTHER): Payer: Medicare Other

## 2016-05-12 DIAGNOSIS — I5022 Chronic systolic (congestive) heart failure: Secondary | ICD-10-CM

## 2016-05-12 DIAGNOSIS — Z95810 Presence of automatic (implantable) cardiac defibrillator: Secondary | ICD-10-CM

## 2016-05-12 NOTE — Telephone Encounter (Signed)
LMOVM reminding pt to send remote transmission.   

## 2016-05-12 NOTE — Progress Notes (Signed)
EPIC Encounter for ICM Monitoring  Patient Name: George Medina is a 83 y.o. male Date: 05/12/2016 Primary Care Physican: Clinton Quant, MD Primary Cardiologist:Miller ([facility]) Electrophysiologist: Allred Dry Weight:171 lbs Bi-V Pacing: 95%      Attempted ICM call and unable to reach.  Left message to return call.  Transmission reviewed.   Thoracic impedance returned to normal after taking 1 extra Furosemide tablet x 2 day as recommended.   Recommendations:  None- Unable to reach   Follow-up plan: ICM clinic phone appointment on 06/08/2016.  Copy of ICM check sent to device physician.   3 month ICM trend : 05/12/2016   1 Year ICM trend:      [name], RN 05/12/2016 3:25 PM

## 2016-05-12 NOTE — Progress Notes (Signed)
Returned call and he stated he was feeling good.  Denied any fluid symptoms.  Reviewed transmission.  Next ICM transmission 06/08/2016

## 2016-06-08 ENCOUNTER — Telehealth: Payer: Self-pay | Admitting: Cardiology

## 2016-06-08 ENCOUNTER — Ambulatory Visit (INDEPENDENT_AMBULATORY_CARE_PROVIDER_SITE_OTHER): Payer: Medicare Other | Admitting: *Deleted

## 2016-06-08 DIAGNOSIS — Z95810 Presence of automatic (implantable) cardiac defibrillator: Secondary | ICD-10-CM | POA: Diagnosis not present

## 2016-06-08 DIAGNOSIS — I429 Cardiomyopathy, unspecified: Secondary | ICD-10-CM

## 2016-06-08 DIAGNOSIS — I5022 Chronic systolic (congestive) heart failure: Secondary | ICD-10-CM | POA: Diagnosis not present

## 2016-06-08 NOTE — Telephone Encounter (Signed)
Spoke with pt and reminded pt of remote transmission that is due today. Pt verbalized understanding.   

## 2016-06-09 LAB — CUP PACEART REMOTE DEVICE CHECK
Battery Remaining Longevity: 26 mo
Battery Remaining Percentage: 30 %
Brady Statistic AS VS Percent: 3.3 %
Brady Statistic RA Percent Paced: 3.6 %
HighPow Impedance: 49 Ohm
Implantable Lead Implant Date: 20070102
Implantable Lead Implant Date: 20070102
Implantable Lead Location: 753858
Implantable Lead Location: 753860
Implantable Lead Model: 4194
Implantable Lead Model: 5076
Implantable Lead Model: 7001
Implantable Pulse Generator Implant Date: 20120522
Lead Channel Impedance Value: 410 Ohm
Lead Channel Impedance Value: 600 Ohm
Lead Channel Pacing Threshold Amplitude: 0.5 V
Lead Channel Pacing Threshold Pulse Width: 0.5 ms
Lead Channel Sensing Intrinsic Amplitude: 12 mV
Lead Channel Sensing Intrinsic Amplitude: 2.6 mV
Lead Channel Setting Pacing Amplitude: 2 V
Lead Channel Setting Pacing Amplitude: 2 V
Lead Channel Setting Pacing Amplitude: 2.5 V
Lead Channel Setting Pacing Pulse Width: 0.5 ms
MDC IDC LEAD IMPLANT DT: 20070102
MDC IDC LEAD LOCATION: 753859
MDC IDC MSMT BATTERY VOLTAGE: 2.84 V
MDC IDC MSMT LEADCHNL LV PACING THRESHOLD AMPLITUDE: 0.5 V
MDC IDC MSMT LEADCHNL LV PACING THRESHOLD PULSEWIDTH: 0.5 ms
MDC IDC MSMT LEADCHNL RA IMPEDANCE VALUE: 460 Ohm
MDC IDC MSMT LEADCHNL RV PACING THRESHOLD AMPLITUDE: 1 V
MDC IDC MSMT LEADCHNL RV PACING THRESHOLD PULSEWIDTH: 0.5 ms
MDC IDC PG SERIAL: 643216
MDC IDC SESS DTM: 20180205154104
MDC IDC SET LEADCHNL RV PACING PULSEWIDTH: 0.5 ms
MDC IDC SET LEADCHNL RV SENSING SENSITIVITY: 2 mV
MDC IDC STAT BRADY AP VP PERCENT: 4.9 %
MDC IDC STAT BRADY AP VS PERCENT: 1 %
MDC IDC STAT BRADY AS VP PERCENT: 90 %

## 2016-06-09 NOTE — Progress Notes (Signed)
Remote ICD transmission.   

## 2016-06-09 NOTE — Progress Notes (Signed)
EPIC Encounter for ICM Monitoring  Patient Name: George Medina is a 83 y.o. male Date: 06/09/2016 Primary Care Physican: Clinton Quant, MD Primary Cardiologist:Miller ([facility]) Electrophysiologist: Allred Dry Weight:171 lbs Bi-V Pacing: 95%      Heart Failure questions reviewed, pt asymptomatic   Thoracic impedance normal   Recommendations: No changes. Reminded to limit dietary salt intake to 2000 mg/day and fluid intake to < 2 liters/day. Encouraged to call for fluid symptoms.  Follow-up plan: ICM clinic phone appointment on 07/10/2016.   Patient unable to place home monitor by bed so will send manually.  Copy of ICM check sent to primary cardiologist and device physician.   3 month ICM trend: 06/08/2016   1 Year ICM trend:      [name], RN 06/09/2016 2:41 PM

## 2016-06-10 ENCOUNTER — Encounter: Payer: Self-pay | Admitting: Cardiology

## 2016-07-10 ENCOUNTER — Ambulatory Visit (INDEPENDENT_AMBULATORY_CARE_PROVIDER_SITE_OTHER): Payer: Medicare Other

## 2016-07-10 DIAGNOSIS — I5022 Chronic systolic (congestive) heart failure: Secondary | ICD-10-CM | POA: Diagnosis not present

## 2016-07-10 DIAGNOSIS — Z95810 Presence of automatic (implantable) cardiac defibrillator: Secondary | ICD-10-CM | POA: Diagnosis not present

## 2016-07-10 NOTE — Progress Notes (Signed)
EPIC Encounter for ICM Monitoring  Patient Name: George Medina is a 83 y.o. male Date: 07/10/2016 Primary Care Physican: Clinton Quant, MD Primary Cardiologist:Miller ([facility]) Electrophysiologist: Allred Dry Weight:170 lbs Bi-V Pacing: 96%        Heart Failure questions reviewed, pt asymptomatic.   Thoracic impedance normal but was abnormal suggesting fluid accumulation x 10 days 06/26/2016 to 07/06/2016.  Prescribed and confirmed dosage: Furosemide 20 mg 1 tablet daily.   Recommendations: No changes. Reminded to limit dietary salt intake to 2000 mg/day and fluid intake to < 2 liters/day. Encouraged to call for fluid symptoms.  Follow-up plan: ICM clinic phone appointment on 08/10/2016.  Copy of ICM check sent to device physician.   3 month ICM trend: 07/10/2016   1 Year ICM trend:      [name], RN 07/10/2016 12:02 PM

## 2016-08-10 ENCOUNTER — Ambulatory Visit (INDEPENDENT_AMBULATORY_CARE_PROVIDER_SITE_OTHER): Payer: Medicare Other

## 2016-08-10 DIAGNOSIS — I5022 Chronic systolic (congestive) heart failure: Secondary | ICD-10-CM

## 2016-08-10 DIAGNOSIS — Z95810 Presence of automatic (implantable) cardiac defibrillator: Secondary | ICD-10-CM

## 2016-08-10 NOTE — Progress Notes (Signed)
EPIC Encounter for ICM Monitoring  Patient Name: ZENO HICKEL is a 83 y.o. male Date: 08/10/2016 Primary Care Physican: Clinton Quant, MD Primary Cardiologist:Miller ([facility]) Electrophysiologist: Allred Dry Weight:170lbs Bi-V Pacing: 96%       Heart Failure questions reviewed, pt asymptomatic    Thoracic impedance normal but was abnormal suggesting fluid accumulation from 07/15/2016 to 07/26/2016.  Prescribed dosage: Furosemide 20 mg 1 tablet daily.   Recommendations: No changes. Discussed to limit salt intake to 2000 mg/day and fluid intake to < 2 liters/day.  Encouraged to call for fluid symptoms.  Follow-up plan: ICM clinic phone appointment on 09/10/2016.    Copy of ICM check sent to device physician.   3 month ICM trend: 08/10/2016   1 Year ICM trend:      [name], RN 08/10/2016 8:47 AM

## 2016-09-10 ENCOUNTER — Ambulatory Visit (INDEPENDENT_AMBULATORY_CARE_PROVIDER_SITE_OTHER): Payer: Medicare Other | Admitting: *Deleted

## 2016-09-10 DIAGNOSIS — I5022 Chronic systolic (congestive) heart failure: Secondary | ICD-10-CM

## 2016-09-10 DIAGNOSIS — I429 Cardiomyopathy, unspecified: Secondary | ICD-10-CM | POA: Diagnosis not present

## 2016-09-10 DIAGNOSIS — Z95810 Presence of automatic (implantable) cardiac defibrillator: Secondary | ICD-10-CM

## 2016-09-10 NOTE — Progress Notes (Signed)
EPIC Encounter for ICM Monitoring  Patient Name: George Medina is a 83 y.o. male Date: 09/10/2016 Primary Care Physican: Pomposini, Cherly Anderson, MD Primary Cardiologist:Miller ([facility]) Electrophysiologist: Allred Dry Weight:Last ICM weight 170lbs Bi-V Pacing: 96%        Attempted call to patient and unable to reach.  Left message to return call.  Transmission reviewed.    Thoracic impedance is normal but was abnormal suggesting fluid accumulation 09/02/2016 to 09/07/2016.  Pattern developing that patient is starting to retain fluid more often and longer periods of time.  Prescribed dosage: Furosemide 20 mg 1 tablet daily.   Recommendations: NONE - Unable to reach patient   Follow-up plan: ICM clinic phone appointment on 5/241/2018 to recheck fluid levels.    Copy of ICM check sent to device physician.   3 month ICM trend: 09/10/2016   1 Year ICM trend:      [name], RN 09/10/2016 2:53 PM

## 2016-09-10 NOTE — Progress Notes (Signed)
Remote ICD transmission.   

## 2016-09-11 LAB — CUP PACEART REMOTE DEVICE CHECK
Battery Remaining Longevity: 23 mo
Battery Remaining Percentage: 27 %
Battery Voltage: 2.83 V
Brady Statistic RA Percent Paced: 3.8 %
HighPow Impedance: 49 Ohm
Implantable Lead Implant Date: 20070102
Implantable Lead Implant Date: 20070102
Implantable Lead Location: 753858
Implantable Lead Location: 753860
Implantable Lead Model: 5076
Implantable Lead Model: 7001
Implantable Pulse Generator Implant Date: 20120522
Lead Channel Impedance Value: 440 Ohm
Lead Channel Impedance Value: 580 Ohm
Lead Channel Pacing Threshold Amplitude: 0.5 V
Lead Channel Pacing Threshold Amplitude: 1 V
Lead Channel Pacing Threshold Pulse Width: 0.5 ms
Lead Channel Sensing Intrinsic Amplitude: 12 mV
Lead Channel Setting Pacing Amplitude: 2 V
Lead Channel Setting Pacing Amplitude: 2 V
Lead Channel Setting Pacing Pulse Width: 0.5 ms
MDC IDC LEAD IMPLANT DT: 20070102
MDC IDC LEAD LOCATION: 753859
MDC IDC MSMT LEADCHNL LV PACING THRESHOLD AMPLITUDE: 0.5 V
MDC IDC MSMT LEADCHNL LV PACING THRESHOLD PULSEWIDTH: 0.5 ms
MDC IDC MSMT LEADCHNL RA IMPEDANCE VALUE: 450 Ohm
MDC IDC MSMT LEADCHNL RA PACING THRESHOLD PULSEWIDTH: 0.5 ms
MDC IDC MSMT LEADCHNL RA SENSING INTR AMPL: 3.3 mV
MDC IDC PG SERIAL: 643216
MDC IDC SESS DTM: 20180510081311
MDC IDC SET LEADCHNL RV PACING AMPLITUDE: 2.5 V
MDC IDC SET LEADCHNL RV PACING PULSEWIDTH: 0.5 ms
MDC IDC SET LEADCHNL RV SENSING SENSITIVITY: 2 mV
MDC IDC STAT BRADY AP VP PERCENT: 4.7 %
MDC IDC STAT BRADY AP VS PERCENT: 1 %
MDC IDC STAT BRADY AS VP PERCENT: 92 %
MDC IDC STAT BRADY AS VS PERCENT: 2.2 %

## 2016-09-24 ENCOUNTER — Ambulatory Visit (INDEPENDENT_AMBULATORY_CARE_PROVIDER_SITE_OTHER): Payer: Self-pay

## 2016-09-24 DIAGNOSIS — Z95810 Presence of automatic (implantable) cardiac defibrillator: Secondary | ICD-10-CM

## 2016-09-24 DIAGNOSIS — I5022 Chronic systolic (congestive) heart failure: Secondary | ICD-10-CM

## 2016-09-24 NOTE — Progress Notes (Signed)
EPIC Encounter for ICM Monitoring  Patient Name: RIK WADEL is a 83 y.o. male Date: 09/24/2016 Primary Care Physican: Pomposini, Cherly Anderson, MD Primary Cardiologist:Gary Sabra Heck, MD ([facility]) Electrophysiologist: Allred Dry Weight:168lbs Bi-V Pacing: 97%         Heart Failure questions reviewed, pt asymptomatic.   Thoracic impedance normal but starting below baseline suggesting fluid accumulation starting yesterday.  Prescribed dosage: Furosemide 20 mg 1 tablet daily.  Patient has reported Dr Sabra Heck has instructed him to take extra Furosemide when needed.  Recommendations:  He think he ate some foods high in sodium the last few days and will cut back on salt to see if it improves.  Encouraged to call for fluid symptoms or use local ER for any urgent symptoms.  Follow-up plan: ICM clinic phone appointment on 10/12/2016.   Copy of ICM check sent to device physician.   3 month ICM trend: 09/24/2016   1 Year ICM trend:      [name], RN 09/24/2016 11:25 AM

## 2016-10-12 ENCOUNTER — Ambulatory Visit (INDEPENDENT_AMBULATORY_CARE_PROVIDER_SITE_OTHER): Payer: Medicare Other

## 2016-10-12 DIAGNOSIS — I5022 Chronic systolic (congestive) heart failure: Secondary | ICD-10-CM | POA: Diagnosis not present

## 2016-10-12 DIAGNOSIS — Z95810 Presence of automatic (implantable) cardiac defibrillator: Secondary | ICD-10-CM

## 2016-10-12 NOTE — Progress Notes (Signed)
EPIC Encounter for ICM Monitoring  Patient Name: George Medina is a 83 y.o. male Date: 10/12/2016 Primary Care Physican: Pomposini, Cherly Anderson, MD Primary Cardiologist:Gary Sabra Heck, MD ([facility]) Electrophysiologist: Allred Dry Weight:Last known weight 168lbs Bi-V Pacing: 97%       Attempted call to patient and unable to reach.  Left message to return call.  Transmission reviewed.    Thoracic impedance abnormal suggesting fluid accumulation on 10/11/16.  Prescribed dosage: Furosemide 20 mg 1 tablet daily.  Patient has reported Dr Sabra Heck has instructed him to take extra Furosemide when needed.  Recommendations: NONE - Unable to reach patient   Follow-up plan: ICM clinic phone appointment on 10/20/2016 to recheck fluid levels.    Copy of ICM check sent to device physician.   3 month ICM trend: 10/12/2016   1 Year ICM trend:      [name], RN 10/12/2016 8:40 AM

## 2016-10-13 ENCOUNTER — Telehealth: Payer: Self-pay

## 2016-10-13 NOTE — Telephone Encounter (Signed)
Remote ICM transmission received.  Attempted patient call and left message to return call.   

## 2016-10-13 NOTE — Progress Notes (Signed)
Patient returned call.  Reviewed transmission.  He stated he feels fine and thinks he may be drinking too much fluid. Advised to limit fluids and salt.  He said he will take an extra fluid pill today.  Will recheck fluid levels on 10/22/2016.

## 2016-10-22 ENCOUNTER — Ambulatory Visit (INDEPENDENT_AMBULATORY_CARE_PROVIDER_SITE_OTHER): Payer: Self-pay

## 2016-10-22 DIAGNOSIS — Z95810 Presence of automatic (implantable) cardiac defibrillator: Secondary | ICD-10-CM

## 2016-10-22 DIAGNOSIS — I5022 Chronic systolic (congestive) heart failure: Secondary | ICD-10-CM

## 2016-10-22 NOTE — Progress Notes (Signed)
EPIC Encounter for ICM Monitoring  Patient Name: George Medina is a 83 y.o. male Date: 10/22/2016 Primary Care Physican: Pomposini, Cherly Anderson, MD Primary Cardiologist:Gary Sabra Heck, MD([facility]) Electrophysiologist: Allred Dry Weight:Last known weight 168lbs Bi-V Pacing:  97%       Heart Failure questions reviewed, pt asymptomatic .   Thoracic impedance returned to normal.  Prescribed dosage: Furosemide 20 mg 1 tablet daily. Patient has reportedDr Sabra Heck has instructed him to take extra Furosemide when needed.  Recommendations: No changes.  Advised to limit salt intake to 2000 mg/day and fluid intake to < 2 liters/day.  Encouraged to call for fluid symptoms.  Follow-up plan: ICM clinic phone appointment on 11/17/2016.    Copy of ICM check sent to device physician.   3 month ICM trend: 10/22/2016     1 Year ICM trend:      [name], RN 10/22/2016 4:56 PM

## 2016-11-16 ENCOUNTER — Other Ambulatory Visit: Payer: Self-pay | Admitting: Cardiology

## 2016-11-16 NOTE — Progress Notes (Signed)
Opened in error

## 2016-11-17 ENCOUNTER — Ambulatory Visit (INDEPENDENT_AMBULATORY_CARE_PROVIDER_SITE_OTHER): Payer: Medicare Other

## 2016-11-17 DIAGNOSIS — I5022 Chronic systolic (congestive) heart failure: Secondary | ICD-10-CM | POA: Diagnosis not present

## 2016-11-17 DIAGNOSIS — Z95810 Presence of automatic (implantable) cardiac defibrillator: Secondary | ICD-10-CM

## 2016-11-17 NOTE — Progress Notes (Signed)
EPIC Encounter for ICM Monitoring  Patient Name: JASPREET HOLLINGS is a 83 y.o. male Date: 11/17/2016 Primary Care Physican: Pomposini, Cherly Anderson, MD Primary Cardiologist:Gary Sabra Heck, MD([facility]) Electrophysiologist: Allred Dry Weight:Last known weight 168lbs Bi-V Pacing:  97%          Heart Failure questions reviewed, pt asymptomatic.   Thoracic impedance abnormal suggesting fluid accumulation starting 11/15/2016.  He reported his daughter was in town for his birthday and they ate foods higher in sodium the last 2 days.  Prescribed dosage: Furosemide 20 mg 1 tablet daily. Patient has reportedDr Sabra Heck has instructed him to take extra Furosemide when needed.  Recommendations:  Patient will take 1 extra Furosemide tomorrow and resume low salt diet.  Follow-up plan: ICM clinic phone appointment on 11/26/2016 to recheck fluid levels.    Copy of ICM check sent to device physician.   3 month ICM trend: 11/16/2016   1 Year ICM trend:      [name], RN 11/17/2016 9:57 AM

## 2016-11-26 ENCOUNTER — Telehealth: Payer: Self-pay | Admitting: Cardiology

## 2016-11-26 ENCOUNTER — Ambulatory Visit (INDEPENDENT_AMBULATORY_CARE_PROVIDER_SITE_OTHER): Payer: Self-pay

## 2016-11-26 DIAGNOSIS — Z95810 Presence of automatic (implantable) cardiac defibrillator: Secondary | ICD-10-CM

## 2016-11-26 DIAGNOSIS — I5022 Chronic systolic (congestive) heart failure: Secondary | ICD-10-CM

## 2016-11-26 NOTE — Progress Notes (Signed)
EPIC Encounter for ICM Monitoring  Patient Name: OSHUA MCCONAHA is a 83 y.o. male Date: 11/26/2016 Primary Care Physican: Pomposini, Cherly Anderson, MD Primary Cardiologist:Gary Sabra Heck, MD([facility]) Electrophysiologist: Allred Dry Weight:163lbs Bi-V Pacing: 97%          Heart Failure questions reviewed, pt asymptomatic.   Thoracic impedance returned to normal after taking extra Furosemide.  Prescribed dosage: Furosemide 20 mg 1 tablet daily. Patient has reportedDr Sabra Heck has instructed him to take extra Furosemide when needed.  Recommendations: No changes.  Encouraged to call for fluid symptoms.  Follow-up plan: ICM clinic phone appointment on 12/21/2016. .  Copy of ICM check sent to device physician.   3 month ICM trend: 11/26/2016   1 Year ICM trend:      [name], RN 11/26/2016 1:39 PM

## 2016-11-26 NOTE — Telephone Encounter (Signed)
Spoke with pt and reminded pt of remote transmission that is due today. Pt verbalized understanding.   

## 2016-12-21 ENCOUNTER — Telehealth: Payer: Self-pay

## 2016-12-21 ENCOUNTER — Ambulatory Visit (INDEPENDENT_AMBULATORY_CARE_PROVIDER_SITE_OTHER): Payer: Medicare Other | Admitting: *Deleted

## 2016-12-21 DIAGNOSIS — I429 Cardiomyopathy, unspecified: Secondary | ICD-10-CM

## 2016-12-21 DIAGNOSIS — Z95810 Presence of automatic (implantable) cardiac defibrillator: Secondary | ICD-10-CM

## 2016-12-21 DIAGNOSIS — I5022 Chronic systolic (congestive) heart failure: Secondary | ICD-10-CM | POA: Diagnosis not present

## 2016-12-21 LAB — CUP PACEART REMOTE DEVICE CHECK
Battery Remaining Longevity: 19 mo
Date Time Interrogation Session: 20180829112326
Implantable Lead Implant Date: 20070102
Implantable Lead Implant Date: 20070102
Implantable Lead Implant Date: 20070102
Implantable Lead Location: 753858
Implantable Lead Model: 4194
Implantable Lead Model: 7001
Implantable Pulse Generator Implant Date: 20120522
Lead Channel Setting Pacing Amplitude: 2 V
Lead Channel Setting Pacing Amplitude: 2.5 V
Lead Channel Setting Pacing Pulse Width: 0.5 ms
Lead Channel Setting Pacing Pulse Width: 0.5 ms
Lead Channel Setting Sensing Sensitivity: 2 mV
MDC IDC LEAD LOCATION: 753859
MDC IDC LEAD LOCATION: 753860
MDC IDC PG SERIAL: 643216
MDC IDC SET LEADCHNL LV PACING AMPLITUDE: 2 V

## 2016-12-21 NOTE — Progress Notes (Signed)
EPIC Encounter for ICM Monitoring  Patient Name: George Medina is a 83 y.o. male Date: 12/21/2016 Primary Care Physican: Pomposini, Cherly Anderson, MD Primary Cardiologist:Gary Sabra Heck, MD([facility]) Electrophysiologist: Allred Dry Weight:Previous ICM weight163lbs Bi-V Pacing: 97%     Attempted call to patient and unable to reach.  Left message to return call.  Transmission reviewed.    Thoracic impedance normal   Prescribed dosage: Furosemide 20 mg 1 tablet daily. Patient has reportedDr Sabra Heck has instructed him to take extra Furosemide when needed.  Recommendations: NONE - Unable to reach patient   Follow-up plan: ICM clinic phone appointment on 01/21/2017.  Office appointment scheduled 04/09/2017 with Dr. Rayann Heman  Copy of ICM check sent to device physician.   3 month ICM trend: 12/21/2016   1 Year ICM trend:      [name], RN 12/21/2016 8:39 AM

## 2016-12-21 NOTE — Progress Notes (Signed)
ICD Remote Transmission  

## 2016-12-21 NOTE — Telephone Encounter (Signed)
Remote ICM transmission received.  Attempted patient call and left message to return call.   

## 2017-01-01 ENCOUNTER — Encounter: Payer: Self-pay | Admitting: Cardiology

## 2017-01-21 ENCOUNTER — Ambulatory Visit (INDEPENDENT_AMBULATORY_CARE_PROVIDER_SITE_OTHER): Payer: Medicare Other

## 2017-01-21 DIAGNOSIS — I5022 Chronic systolic (congestive) heart failure: Secondary | ICD-10-CM

## 2017-01-21 DIAGNOSIS — Z95810 Presence of automatic (implantable) cardiac defibrillator: Secondary | ICD-10-CM

## 2017-01-21 NOTE — Progress Notes (Signed)
EPIC Encounter for ICM Monitoring  Patient Name: George Medina is a 83 y.o. male Date: 01/21/2017 Primary Care Physican: Pomposini, Cherly Anderson, MD Primary Cardiologist:Gary Sabra Heck, MD([facility]) Electrophysiologist: Allred Dry Weight:162lbs Bi-V Pacing: 97%           Heart Failure questions reviewed, pt asymptomatic .   Thoracic impedance normal with intermittent days of abnormal suggesting fluid accumulation.  Prescribed dosage: Furosemide 20 mg 1 tablet daily. Patient has reportedDr Sabra Heck has instructed him to take extra Furosemide when needed.  Recommendations: No changes.   Encouraged to call for fluid symptoms.  Follow-up plan: ICM clinic phone appointment on 02/22/2017.  Office appointment scheduled 04/09/2017 with Dr. Rayann Heman.  Copy of ICM check sent to Dr. Rayann Heman.   3 month ICM trend: 01/21/2017   1 Year ICM trend:      [name], RN 01/21/2017 1:21 PM

## 2017-02-22 ENCOUNTER — Ambulatory Visit (INDEPENDENT_AMBULATORY_CARE_PROVIDER_SITE_OTHER): Payer: Medicare Other

## 2017-02-22 DIAGNOSIS — Z95810 Presence of automatic (implantable) cardiac defibrillator: Secondary | ICD-10-CM

## 2017-02-22 DIAGNOSIS — I5022 Chronic systolic (congestive) heart failure: Secondary | ICD-10-CM | POA: Diagnosis not present

## 2017-02-22 NOTE — Progress Notes (Signed)
EPIC Encounter for ICM Monitoring  Patient Name: George Medina is a 83 y.o. male Date: 02/22/2017 Primary Care Physican: Pomposini, Cherly Anderson, MD Primary Cardiologist:Gary Sabra Heck, MD([facility]) Electrophysiologist: Allred Dry Weight:170lbs Bi-V Pacing: 97%       Heart Failure questions reviewed, pt asymptomatic.   Thoracic impedance normal.  Prescribed dosage: Furosemide 20 mg 1 tablet daily. Patient has reportedDr Sabra Heck has instructed him to take extra Furosemide when needed.  Recommendations: No changes.   Encouraged to call for fluid symptoms.  Follow-up plan: ICM clinic phone appointment on 03/30/2017.  Office appointment scheduled 04/09/2017 with Dr. Rayann Heman.  Copy of ICM check sent to Dr. Rayann Heman.   3 month ICM trend: 02/22/2017   1 Year ICM trend:      [name], RN 02/22/2017 9:55 AM

## 2017-03-30 ENCOUNTER — Ambulatory Visit (INDEPENDENT_AMBULATORY_CARE_PROVIDER_SITE_OTHER): Payer: Medicare Other | Admitting: *Deleted

## 2017-03-30 DIAGNOSIS — Z95810 Presence of automatic (implantable) cardiac defibrillator: Secondary | ICD-10-CM

## 2017-03-30 DIAGNOSIS — I5022 Chronic systolic (congestive) heart failure: Secondary | ICD-10-CM | POA: Diagnosis not present

## 2017-03-30 DIAGNOSIS — I429 Cardiomyopathy, unspecified: Secondary | ICD-10-CM

## 2017-03-31 NOTE — Progress Notes (Signed)
Remote ICD transmission.   

## 2017-04-01 ENCOUNTER — Telehealth: Payer: Self-pay

## 2017-04-01 NOTE — Progress Notes (Signed)
Patient returned call and stated he was feeling really good.  He denied any fluid symptoms.  Transmission reviewed.  Current weight 166 lbs.  Next ICM remote transmission 05/10/2016 since he has office defib ckeck with Dr Rayann Heman 04/09/2017.  Encouraged to call for any fluid symptoms.

## 2017-04-01 NOTE — Progress Notes (Signed)
EPIC Encounter for ICM Monitoring  Patient Name: George Medina is a 83 y.o. male Date: 04/01/2017 Primary Care Physican: Pomposini, Cherly Anderson, MD Primary Cardiologist:Gary Sabra Heck, MD([facility]) Electrophysiologist: Allred Dry Weight:Previous weight170lbs Bi-V Pacing: 97%        Attempted call to patient and unable to reach.  Left message to return call.  Transmission reviewed.    Thoracic impedance normal.  Prescribed dosage: Furosemide 20 mg 1 tablet daily. Patient has reportedDr Sabra Heck has instructed him to take extra Furosemide when needed.  Recommendations: NONE - Unable to reach.  Follow-up plan: ICM clinic phone appointment on 05/10/2017.  Office appointment scheduled 04/09/2017 with Dr. Rayann Heman.  Copy of ICM check sent to Dr. Rayann Heman.   3 month ICM trend: 03/30/2017    1 Year ICM trend:       [name], RN 04/01/2017 3:07 PM

## 2017-04-01 NOTE — Telephone Encounter (Signed)
Remote ICM transmission received.  Attempted call to patient and left message to return call. 

## 2017-04-02 ENCOUNTER — Encounter: Payer: Self-pay | Admitting: Cardiology

## 2017-04-09 ENCOUNTER — Ambulatory Visit (INDEPENDENT_AMBULATORY_CARE_PROVIDER_SITE_OTHER): Payer: Medicare Other | Admitting: Internal Medicine

## 2017-04-09 ENCOUNTER — Encounter: Payer: Self-pay | Admitting: Internal Medicine

## 2017-04-09 VITALS — BP 158/62 | HR 65 | Ht 69.0 in | Wt 166.0 lb

## 2017-04-09 DIAGNOSIS — I5022 Chronic systolic (congestive) heart failure: Secondary | ICD-10-CM

## 2017-04-09 DIAGNOSIS — I472 Ventricular tachycardia: Secondary | ICD-10-CM

## 2017-04-09 DIAGNOSIS — I4729 Other ventricular tachycardia: Secondary | ICD-10-CM

## 2017-04-09 DIAGNOSIS — I442 Atrioventricular block, complete: Secondary | ICD-10-CM

## 2017-04-09 NOTE — Progress Notes (Signed)
PCP: Pomposini, Cherly Anderson, MD   Primary EP: Dr Rayann Heman  George Medina is a 83 y.o. male who presents today for routine electrophysiology followup.  Since last being seen in our clinic, the patient reports doing very well.  Today, he denies symptoms of palpitations, chest pain, shortness of breath,  lower extremity edema, dizziness, presyncope, syncope, or ICD shocks.  The patient is otherwise without complaint today.   Past Medical History:  Diagnosis Date  . A-fib ([place])    anticoagulated with coumadin  . Brain aneurysm 1994   required surgery x2  . CHF (congestive heart failure) ([facility])    chronic-systolic now class I-II  . Complete heart block ([place])   . Diabetes mellitus   . Hypertension   . Nonischemic cardiomyopathy ([place])   . Nonsustained ventricular tachycardia ([place])    Past Surgical History:  Procedure Laterality Date  . CARDIAC DEFIBRILLATOR PLACEMENT     most recent gen change 5/12,  he has a SJM 7001 lead which was fluroscopically normal during generator change 5/12.  LV and RV leads were switched in the header  . CEREBRAL ANEURYSM REPAIR     x2    ROS- all systems are reviewed and negative except as per HPI above  Current Outpatient Medications  Medication Sig Dispense Refill  . albuterol (PROVENTIL HFA;VENTOLIN HFA) 108 (90 BASE) MCG/ACT inhaler Inhale 2 puffs into the lungs every 6 (six) hours as needed.      Marland Kitchen albuterol (PROVENTIL) (2.5 MG/3ML) 0.083% nebulizer solution Take 3 mLs by nebulization Once daily as needed.    Marland Kitchen aspirin EC 81 MG tablet Take 81 mg by mouth daily.    . carvedilol (COREG) 25 MG tablet Take 25 mg by mouth 2 (two) times daily with a meal.      . cholecalciferol (VITAMIN D) 1000 UNITS tablet Take 1,000 Units by mouth daily.      . finasteride (PROSCAR) 5 MG tablet Take 5 mg by mouth daily.     . fluticasone (FLONASE) 50 MCG/ACT nasal spray Place 1 spray into the nose as needed.     . furosemide (LASIX) 20 MG tablet Take 20 mg by mouth daily.      Marland Kitchen glipiZIDE (GLUCOTROL XL) 10 MG 24 hr tablet Take 10 mg by mouth daily with breakfast.    . metFORMIN (GLUCOPHAGE) 1000 MG tablet Take 1,000 mg by mouth 2 (two) times daily with a meal.     . NEXIUM 40 MG capsule Take 40 mg by mouth as needed.     . ramipril (ALTACE) 10 MG capsule Take 10 mg by mouth daily.      . sitaGLIPtin (JANUVIA) 100 MG tablet Take 100 mg by mouth daily.     No current facility-administered medications for this visit.     Physical Exam: Vitals:   04/09/17 0935  BP: (!) 158/62  Pulse: 65  SpO2: 95%  Weight: 166 lb (75.3 kg)  Height: 5\' 9"  (1.753 m)    GEN- The patient is well appearing, alert and oriented x 3 today.   Head- normocephalic, atraumatic Eyes-  Sclera clear, conjunctiva pink Ears- hearing intact Oropharynx- clear Lungs- Clear to ausculation bilaterally, normal work of breathing Chest- ICD pocket is well healed Heart- Regular rate and rhythm, no murmurs, rubs or gallops, PMI not laterally displaced GI- soft, NT, ND, + BS Extremities- no clubbing, cyanosis, or edema  ICD interrogation- reviewed in detail today,  See PACEART report   Assessment and Plan:  1.  Chronic systolic dysfunction euvolemic today Stable on an appropriate medical regimen Normal ICD function See Pace Art report No changes today  2. Complete heart block Normal BiV ICD function No changes today  3. AFib Well controlled, without episodes in 4 years  4. HTN Stable No change required today  5. VT Well controlled No arrhythmias in 3 years  Merlin Return in 1 year  Thompson Grayer MD, [facility] 04/09/2017 10:34 AM

## 2017-04-09 NOTE — Patient Instructions (Addendum)
Medication Instructions:   Continue all current medications.  Labwork:  none  Testing/Procedures:  none  Follow-Up:  Your physician recommends that you schedule a follow-up appointment in:  1 year   Any Other Special Instructions Will Be Listed Below (If Applicable).  Your next device check from home is on 06/29/17.  If you need a refill on your cardiac medications before your next appointment, please call your pharmacy.

## 2017-04-11 LAB — CUP PACEART REMOTE DEVICE CHECK
Battery Remaining Longevity: 17 mo
Battery Voltage: 2.77 V
Brady Statistic AP VP Percent: 5 %
Brady Statistic AS VS Percent: 1.9 %
Brady Statistic RA Percent Paced: 4 %
HighPow Impedance: 45 Ohm
Implantable Lead Implant Date: 20070102
Implantable Lead Implant Date: 20070102
Implantable Lead Location: 753859
Implantable Lead Location: 753860
Implantable Lead Model: 5076
Implantable Pulse Generator Implant Date: 20120522
Lead Channel Impedance Value: 380 Ohm
Lead Channel Impedance Value: 550 Ohm
Lead Channel Pacing Threshold Amplitude: 0.5 V
Lead Channel Pacing Threshold Pulse Width: 0.5 ms
Lead Channel Pacing Threshold Pulse Width: 0.5 ms
Lead Channel Sensing Intrinsic Amplitude: 12 mV
Lead Channel Sensing Intrinsic Amplitude: 2.6 mV
Lead Channel Setting Pacing Amplitude: 2 V
MDC IDC LEAD IMPLANT DT: 20070102
MDC IDC LEAD LOCATION: 753858
MDC IDC MSMT BATTERY REMAINING PERCENTAGE: 20 %
MDC IDC MSMT LEADCHNL LV PACING THRESHOLD AMPLITUDE: 0.5 V
MDC IDC MSMT LEADCHNL RA IMPEDANCE VALUE: 450 Ohm
MDC IDC MSMT LEADCHNL RA PACING THRESHOLD PULSEWIDTH: 0.5 ms
MDC IDC MSMT LEADCHNL RV PACING THRESHOLD AMPLITUDE: 1 V
MDC IDC PG SERIAL: 643216
MDC IDC SESS DTM: 20181125155209
MDC IDC SET LEADCHNL LV PACING AMPLITUDE: 2 V
MDC IDC SET LEADCHNL LV PACING PULSEWIDTH: 0.5 ms
MDC IDC SET LEADCHNL RV PACING AMPLITUDE: 2.5 V
MDC IDC SET LEADCHNL RV PACING PULSEWIDTH: 0.5 ms
MDC IDC SET LEADCHNL RV SENSING SENSITIVITY: 2 mV
MDC IDC STAT BRADY AP VS PERCENT: 1 %
MDC IDC STAT BRADY AS VP PERCENT: 92 %

## 2017-04-12 LAB — CUP PACEART INCLINIC DEVICE CHECK
Brady Statistic RA Percent Paced: 3.9 %
Brady Statistic RV Percent Paced: 97 %
Date Time Interrogation Session: 20181207154327
HIGH POWER IMPEDANCE MEASURED VALUE: 53.3496
Implantable Lead Implant Date: 20070102
Implantable Lead Location: 753858
Implantable Lead Location: 753859
Implantable Pulse Generator Implant Date: 20120522
Lead Channel Impedance Value: 450 Ohm
Lead Channel Impedance Value: 475 Ohm
Lead Channel Impedance Value: 562.5 Ohm
Lead Channel Pacing Threshold Amplitude: 0.5 V
Lead Channel Pacing Threshold Amplitude: 0.75 V
Lead Channel Pacing Threshold Amplitude: 0.75 V
Lead Channel Pacing Threshold Amplitude: 1 V
Lead Channel Pacing Threshold Pulse Width: 0.5 ms
Lead Channel Pacing Threshold Pulse Width: 0.5 ms
Lead Channel Sensing Intrinsic Amplitude: 12 mV
Lead Channel Setting Pacing Amplitude: 2 V
Lead Channel Setting Pacing Amplitude: 2.5 V
Lead Channel Setting Pacing Pulse Width: 0.5 ms
Lead Channel Setting Sensing Sensitivity: 2 mV
MDC IDC LEAD IMPLANT DT: 20070102
MDC IDC LEAD IMPLANT DT: 20070102
MDC IDC LEAD LOCATION: 753860
MDC IDC MSMT BATTERY REMAINING LONGEVITY: 16 mo
MDC IDC MSMT LEADCHNL LV PACING THRESHOLD PULSEWIDTH: 0.5 ms
MDC IDC MSMT LEADCHNL RA PACING THRESHOLD AMPLITUDE: 0.5 V
MDC IDC MSMT LEADCHNL RA PACING THRESHOLD PULSEWIDTH: 0.5 ms
MDC IDC MSMT LEADCHNL RA SENSING INTR AMPL: 2.6 mV
MDC IDC MSMT LEADCHNL RV PACING THRESHOLD AMPLITUDE: 1 V
MDC IDC MSMT LEADCHNL RV PACING THRESHOLD PULSEWIDTH: 0.5 ms
MDC IDC MSMT LEADCHNL RV PACING THRESHOLD PULSEWIDTH: 0.5 ms
MDC IDC SET LEADCHNL LV PACING AMPLITUDE: 2 V
MDC IDC SET LEADCHNL RV PACING PULSEWIDTH: 0.5 ms
Pulse Gen Serial Number: 643216

## 2017-05-07 ENCOUNTER — Encounter: Payer: Medicare Other | Admitting: Internal Medicine

## 2017-05-10 ENCOUNTER — Ambulatory Visit (INDEPENDENT_AMBULATORY_CARE_PROVIDER_SITE_OTHER): Payer: Medicare Other

## 2017-05-10 DIAGNOSIS — Z95810 Presence of automatic (implantable) cardiac defibrillator: Secondary | ICD-10-CM | POA: Diagnosis not present

## 2017-05-10 DIAGNOSIS — I5022 Chronic systolic (congestive) heart failure: Secondary | ICD-10-CM | POA: Diagnosis not present

## 2017-05-10 NOTE — Progress Notes (Signed)
EPIC Encounter for ICM Monitoring  Patient Name: BOSCO PAPARELLA is a 84 y.o. male Date: 05/10/2017 Primary Care Physican: Pomposini, Cherly Anderson, MD Primary Cardiologist:Gary Sabra Heck, MD([facility]) Electrophysiologist: Allred Dry Weight:174lbs Bi-V Pacing: >99%      Heart Failure questions reviewed, pt asymptomatic.   Thoracic impedance normal.  Prescribed dosage: Furosemide 20 mg 1 tablet daily. Patient has reportedDr Sabra Heck has instructed him to take extra Furosemide when needed.  Recommendations: No changes.    Encouraged to call for fluid symptoms.  Follow-up plan: ICM clinic phone appointment on 06/10/2017.    Copy of ICM check sent to Dr. Rayann Heman.   3 month ICM trend: 05/10/2017    1 Year ICM trend:       [name], RN 05/10/2017 8:12 AM

## 2017-06-10 ENCOUNTER — Ambulatory Visit (INDEPENDENT_AMBULATORY_CARE_PROVIDER_SITE_OTHER): Payer: Medicare Other

## 2017-06-10 DIAGNOSIS — I5022 Chronic systolic (congestive) heart failure: Secondary | ICD-10-CM | POA: Diagnosis not present

## 2017-06-10 DIAGNOSIS — Z95810 Presence of automatic (implantable) cardiac defibrillator: Secondary | ICD-10-CM | POA: Diagnosis not present

## 2017-06-10 NOTE — Progress Notes (Signed)
EPIC Encounter for ICM Monitoring  Patient Name: George Medina is a 84 y.o. male Date: 06/10/2017 Primary Care Physican: Pomposini, Cherly Anderson, MD Primary Cardiologist:Gary Sabra Heck, MD([facility]) Electrophysiologist: Allred Dry Weight:173lbs Bi-V Pacing: >99%          Heart Failure questions reviewed, pt asymptomatic.   Thoracic impedance normal but was abnormal suggesting fluid accumulation from 06/07/2017 to 06/08/2017.  Prescribed dosage: Furosemide 20 mg 1 tablet daily. Patient has reportedDr Sabra Heck has instructed him to take extra Furosemide when needed.  Recommendations: No changes.   Encouraged to call for fluid symptoms.  Follow-up plan: ICM clinic phone appointment on 07/12/2017.    Copy of ICM check sent to Dr. Rayann Heman   3 month ICM trend: 06/10/2017    Direct Trend Viewer    1 Year ICM trend:       [name], RN 06/10/2017 10:01 AM

## 2017-06-29 ENCOUNTER — Ambulatory Visit (INDEPENDENT_AMBULATORY_CARE_PROVIDER_SITE_OTHER): Payer: Medicare Other | Admitting: *Deleted

## 2017-06-29 DIAGNOSIS — I429 Cardiomyopathy, unspecified: Secondary | ICD-10-CM | POA: Diagnosis not present

## 2017-06-29 NOTE — Progress Notes (Signed)
Remote ICD transmission.   

## 2017-07-01 ENCOUNTER — Encounter: Payer: Self-pay | Admitting: Cardiology

## 2017-07-12 ENCOUNTER — Telehealth: Payer: Self-pay

## 2017-07-12 ENCOUNTER — Ambulatory Visit (INDEPENDENT_AMBULATORY_CARE_PROVIDER_SITE_OTHER): Payer: Medicare Other

## 2017-07-12 DIAGNOSIS — I5022 Chronic systolic (congestive) heart failure: Secondary | ICD-10-CM

## 2017-07-12 DIAGNOSIS — Z95810 Presence of automatic (implantable) cardiac defibrillator: Secondary | ICD-10-CM

## 2017-07-12 NOTE — Telephone Encounter (Signed)
Remote ICM transmission received.  Attempted call to patient and left message to return call. 

## 2017-07-12 NOTE — Progress Notes (Signed)
EPIC Encounter for ICM Monitoring  Patient Name: GUIDO COMP is a 84 y.o. male Date: 07/12/2017 Primary Care Physican: Pomposini, Cherly Anderson, MD Primary Cardiologist:Gary Sabra Heck, MD([facility]) Electrophysiologist: Allred Dry Weight:Previous weight 173lbs Bi-V Pacing: >99%      Attempted call to patient and unable to reach.  Left message to return call.  Transmission reviewed.    Thoracic impedance slightly starting to trend below baseline suggesting fluid accumulation.  Prescribed dosage: Furosemide 20 mg 1 tablet daily. Patient has reportedDr Sabra Heck has instructed him to take extra Furosemide when needed.  Recommendations:  NONE - Unable to reach. Follow-up plan: ICM clinic phone appointment on 08/12/2017. .  Copy of ICM check sent to Dr. Rayann Heman.   3 month ICM trend: 07/12/2017    1 Year ICM trend:       [name], RN 07/12/2017 10:45 AM

## 2017-07-13 NOTE — Progress Notes (Signed)
Patient returned call.  He said he is feeing fine and denied any fluid symptoms.  Reviewed transmission and advised him to to take 1 extra Furosemide as prescribed by Dr Sabra Heck.  Weight is stable at 167 lbs.  Next ICM transmission 08/12/2017

## 2017-07-15 LAB — CUP PACEART REMOTE DEVICE CHECK
Battery Remaining Longevity: 14 mo
Brady Statistic AP VS Percent: 1 %
Brady Statistic AS VP Percent: 97 %
Brady Statistic AS VS Percent: 1 %
HighPow Impedance: 44 Ohm
Implantable Lead Implant Date: 20070102
Implantable Lead Location: 753858
Implantable Lead Location: 753859
Implantable Lead Model: 7001
Implantable Pulse Generator Implant Date: 20120522
Lead Channel Impedance Value: 440 Ohm
Lead Channel Pacing Threshold Amplitude: 0.5 V
Lead Channel Pacing Threshold Pulse Width: 0.5 ms
Lead Channel Sensing Intrinsic Amplitude: 2.4 mV
Lead Channel Setting Pacing Amplitude: 2 V
Lead Channel Setting Pacing Amplitude: 2 V
Lead Channel Setting Pacing Amplitude: 2.5 V
Lead Channel Setting Pacing Pulse Width: 0.5 ms
Lead Channel Setting Pacing Pulse Width: 0.5 ms
MDC IDC LEAD IMPLANT DT: 20070102
MDC IDC LEAD IMPLANT DT: 20070102
MDC IDC LEAD LOCATION: 753860
MDC IDC MSMT BATTERY REMAINING PERCENTAGE: 17 %
MDC IDC MSMT BATTERY VOLTAGE: 2.75 V
MDC IDC MSMT LEADCHNL LV IMPEDANCE VALUE: 400 Ohm
MDC IDC MSMT LEADCHNL LV PACING THRESHOLD AMPLITUDE: 0.75 V
MDC IDC MSMT LEADCHNL LV PACING THRESHOLD PULSEWIDTH: 0.5 ms
MDC IDC MSMT LEADCHNL RA PACING THRESHOLD PULSEWIDTH: 0.5 ms
MDC IDC MSMT LEADCHNL RV IMPEDANCE VALUE: 600 Ohm
MDC IDC MSMT LEADCHNL RV PACING THRESHOLD AMPLITUDE: 1 V
MDC IDC MSMT LEADCHNL RV SENSING INTR AMPL: 12 mV
MDC IDC SESS DTM: 20190226100044
MDC IDC SET LEADCHNL RV SENSING SENSITIVITY: 2 mV
MDC IDC STAT BRADY AP VP PERCENT: 3 %
MDC IDC STAT BRADY RA PERCENT PACED: 2.6 %
Pulse Gen Serial Number: 643216

## 2017-08-12 ENCOUNTER — Ambulatory Visit (INDEPENDENT_AMBULATORY_CARE_PROVIDER_SITE_OTHER): Payer: Medicare Other

## 2017-08-12 DIAGNOSIS — Z95810 Presence of automatic (implantable) cardiac defibrillator: Secondary | ICD-10-CM | POA: Diagnosis not present

## 2017-08-12 DIAGNOSIS — I5022 Chronic systolic (congestive) heart failure: Secondary | ICD-10-CM | POA: Diagnosis not present

## 2017-08-12 NOTE — Progress Notes (Signed)
EPIC Encounter for ICM Monitoring  Patient Name: George Medina is a 84 y.o. male Date: 08/12/2017 Primary Care Physican: Pomposini, Cherly Anderson, MD Primary Cardiologist:Gary Sabra Heck, MD([facility]) Electrophysiologist: Allred Dry Weight:177lbs Bi-V Pacing: >99%       Heart Failure questions reviewed, pt asymptomatic.  Weight stable.    Thoracic impedance normal but was abnormal suggesting fluid accumulation from 07/22/2017 to 07/30/2017.  Prescribed dosage: Furosemide 20 mg 1 tablet daily. Patient has reportedDr Sabra Heck has instructed him to take extra Furosemide when needed  Recommendations: No changes.   Encouraged to call for fluid symptoms.  Follow-up plan: ICM clinic phone appointment on 09/13/2017.    Copy of ICM check sent to Dr. Rayann Heman.   3 month ICM trend: 08/12/2017    1 Year ICM trend:       [name], RN 08/12/2017 4:58 PM

## 2017-09-13 ENCOUNTER — Ambulatory Visit (INDEPENDENT_AMBULATORY_CARE_PROVIDER_SITE_OTHER): Payer: Medicare Other

## 2017-09-13 DIAGNOSIS — Z95810 Presence of automatic (implantable) cardiac defibrillator: Secondary | ICD-10-CM

## 2017-09-13 DIAGNOSIS — I5022 Chronic systolic (congestive) heart failure: Secondary | ICD-10-CM

## 2017-09-14 NOTE — Progress Notes (Signed)
EPIC Encounter for ICM Monitoring  Patient Name: George Medina is a 84 y.o. male Date: 09/14/2017 Primary Care Physican: Pomposini, Cherly Anderson, MD Primary Cardiologist:Gary Sabra Heck, MD([facility]) Electrophysiologist: Allred Dry Weight:Previous weight 177lbs Bi-V Pacing: >99%       Attempted call to patient and unable to reach.  Left detailed message, per DPR, regarding transmission.  Transmission reviewed.    Thoracic impedance normal but was abnormal suggesting fluid accumulation from 08/23/2017 - 08/30/2017.  Prescribed dosage: Furosemide 20 mg 1 tablet daily. Patient has reportedDr Sabra Heck has instructed him to take extra Furosemide when needed  Recommendations: Left voice mail with ICM number and encouraged to call if experiencing any fluid symptoms.  Follow-up plan: ICM clinic phone appointment on 10/14/2017.    Copy of ICM check sent to Dr. Rayann Heman.   3 month ICM trend: 09/13/2017    1 Year ICM trend:       [name], RN 09/14/2017 11:30 AM

## 2017-09-28 ENCOUNTER — Ambulatory Visit (INDEPENDENT_AMBULATORY_CARE_PROVIDER_SITE_OTHER): Payer: Medicare Other | Admitting: *Deleted

## 2017-09-28 DIAGNOSIS — I472 Ventricular tachycardia: Secondary | ICD-10-CM | POA: Diagnosis not present

## 2017-09-28 DIAGNOSIS — I4729 Other ventricular tachycardia: Secondary | ICD-10-CM

## 2017-09-28 NOTE — Progress Notes (Signed)
Remote ICD transmission.   

## 2017-10-01 ENCOUNTER — Encounter: Payer: Self-pay | Admitting: Cardiology

## 2017-10-14 ENCOUNTER — Ambulatory Visit (INDEPENDENT_AMBULATORY_CARE_PROVIDER_SITE_OTHER): Payer: Medicare Other

## 2017-10-14 DIAGNOSIS — Z95810 Presence of automatic (implantable) cardiac defibrillator: Secondary | ICD-10-CM | POA: Diagnosis not present

## 2017-10-14 DIAGNOSIS — I5022 Chronic systolic (congestive) heart failure: Secondary | ICD-10-CM | POA: Diagnosis not present

## 2017-10-14 NOTE — Progress Notes (Signed)
EPIC Encounter for ICM Monitoring  Patient Name: George Medina is a 84 y.o. male Date: 10/14/2017 Primary Care Physican: Pomposini, Cherly Anderson, MD Primary Cardiologist:Gary Sabra Heck, MD([facility]) Electrophysiologist: Allred Dry Weight: 176lbs Bi-V Pacing: >99%      Heart Failure questions reviewed, pt asymptomatic.   Thoracic impedance normal but was abnormal suggesting fluid accumulation from 09/26/2017 - 10/06/2017.  Impedance was above baseline suggesting dryness 10/07/2017 to 10/13/2017   Prescribed dosage: Furosemide 20 mg 1 tablet daily. Patient has reportedDr Sabra Heck has instructed him to take extra Furosemide when needed  Recommendations: No changes.  Encouraged to call for fluid symptoms.  Follow-up plan: ICM clinic phone appointment on 11/15/2017.    Copy of ICM check sent to Dr. Rayann Heman.   3 month ICM trend: 10/14/2017    1 Year ICM trend:       [name], RN 10/14/2017 12:09 PM

## 2017-10-19 LAB — CUP PACEART REMOTE DEVICE CHECK
Battery Remaining Longevity: 11 mo
Battery Voltage: 2.71 V
Brady Statistic AP VP Percent: 3.3 %
Brady Statistic AS VP Percent: 96 %
Brady Statistic RA Percent Paced: 2.9 %
HIGH POWER IMPEDANCE MEASURED VALUE: 43 Ohm
Implantable Lead Implant Date: 20070102
Implantable Lead Implant Date: 20070102
Implantable Lead Location: 753858
Implantable Lead Location: 753860
Implantable Lead Model: 5076
Implantable Pulse Generator Implant Date: 20120522
Lead Channel Impedance Value: 350 Ohm
Lead Channel Impedance Value: 600 Ohm
Lead Channel Pacing Threshold Amplitude: 0.5 V
Lead Channel Pacing Threshold Amplitude: 0.75 V
Lead Channel Pacing Threshold Pulse Width: 0.5 ms
Lead Channel Pacing Threshold Pulse Width: 0.5 ms
Lead Channel Sensing Intrinsic Amplitude: 12 mV
Lead Channel Sensing Intrinsic Amplitude: 2.4 mV
Lead Channel Setting Pacing Amplitude: 2 V
Lead Channel Setting Pacing Amplitude: 2.5 V
MDC IDC LEAD IMPLANT DT: 20070102
MDC IDC LEAD LOCATION: 753859
MDC IDC MSMT BATTERY REMAINING PERCENTAGE: 13 %
MDC IDC MSMT LEADCHNL RA IMPEDANCE VALUE: 480 Ohm
MDC IDC MSMT LEADCHNL RA PACING THRESHOLD PULSEWIDTH: 0.5 ms
MDC IDC MSMT LEADCHNL RV PACING THRESHOLD AMPLITUDE: 1 V
MDC IDC PG SERIAL: 643216
MDC IDC SESS DTM: 20190528103535
MDC IDC SET LEADCHNL LV PACING AMPLITUDE: 2 V
MDC IDC SET LEADCHNL LV PACING PULSEWIDTH: 0.5 ms
MDC IDC SET LEADCHNL RV PACING PULSEWIDTH: 0.5 ms
MDC IDC SET LEADCHNL RV SENSING SENSITIVITY: 2 mV
MDC IDC STAT BRADY AP VS PERCENT: 1 %
MDC IDC STAT BRADY AS VS PERCENT: 1 %

## 2017-11-15 ENCOUNTER — Ambulatory Visit (INDEPENDENT_AMBULATORY_CARE_PROVIDER_SITE_OTHER): Payer: Medicare Other

## 2017-11-15 DIAGNOSIS — Z95810 Presence of automatic (implantable) cardiac defibrillator: Secondary | ICD-10-CM

## 2017-11-15 DIAGNOSIS — I5022 Chronic systolic (congestive) heart failure: Secondary | ICD-10-CM

## 2017-11-15 NOTE — Progress Notes (Signed)
EPIC Encounter for ICM Monitoring  Patient Name: George Medina is a 84 y.o. male Date: 11/15/2017 Primary Care Physican: Pomposini, Cherly Anderson, MD Primary Cardiologist:Gary Sabra Heck, MD([facility]) Electrophysiologist: Allred Dry Weight:Previous weight 176lbs Bi-V Pacing: >99%        Attempted call to patient and unable to reach.  Left message to return call regarding transmission.  Transmission reviewed.    Thoracic impedance normal.  Prescribed dosage: Furosemide 20 mg 1 tablet daily. Patient has reportedDr Sabra Heck has instructed him to take extra Furosemide when needed  Recommendations: NONE - Unable to reach..  Follow-up plan: ICM clinic phone appointment on 12/16/2017.    Copy of ICM check sent to Dr. Rayann Heman.   3 month ICM trend: 11/15/2017    1 Year ICM trend:       [name], RN 11/15/2017 11:41 AM

## 2017-11-16 ENCOUNTER — Telehealth: Payer: Self-pay

## 2017-11-16 NOTE — Telephone Encounter (Signed)
Remote ICM transmission received.  Attempted call to patient and left message to return call regarding transmission. 

## 2017-12-16 ENCOUNTER — Ambulatory Visit (INDEPENDENT_AMBULATORY_CARE_PROVIDER_SITE_OTHER): Payer: Medicare Other

## 2017-12-16 DIAGNOSIS — I5022 Chronic systolic (congestive) heart failure: Secondary | ICD-10-CM

## 2017-12-16 DIAGNOSIS — Z95810 Presence of automatic (implantable) cardiac defibrillator: Secondary | ICD-10-CM | POA: Diagnosis not present

## 2017-12-16 NOTE — Progress Notes (Signed)
EPIC Encounter for ICM Monitoring  Patient Name: George Medina is a 84 y.o. male Date: 12/16/2017 Primary Care Physican: Pomposini, Cherly Anderson, MD Primary Cardiologist:Gary Sabra Heck, MD([facility]) Electrophysiologist: Allred Dry Weight:176lbs Bi-V Pacing: >99%  Battery Longevity: ~7.7       Heart Failure questions reviewed, pt asymptomatic.   Thoracic impedance normal but was abnormal suggesting fluid accumulation from 11/21/2017 - 12/11/2017.  Prescribed dosage: Furosemide 20 mg 1 tablet daily.   12/17/17 Furosemide was changed to PRN by [facility] Physician due to kidney function  Recommendations: Reinforced sodium restriction to less than 2000 mg daily. He has been salting foods at home.  Encouraged to call for fluid symptoms.  Follow-up plan: ICM clinic phone appointment on 01/17/2018.  Copy of ICM check sent to Dr. Rayann Heman.   3 month ICM trend: 12/16/2017    1 Year ICM trend:       [name], RN 12/16/2017 5:54 PM

## 2017-12-28 ENCOUNTER — Ambulatory Visit (INDEPENDENT_AMBULATORY_CARE_PROVIDER_SITE_OTHER): Payer: Medicare Other | Admitting: *Deleted

## 2017-12-28 DIAGNOSIS — I472 Ventricular tachycardia: Secondary | ICD-10-CM

## 2017-12-28 DIAGNOSIS — I4729 Other ventricular tachycardia: Secondary | ICD-10-CM

## 2017-12-28 NOTE — Progress Notes (Signed)
Remote ICD transmission.   

## 2017-12-30 ENCOUNTER — Encounter: Payer: Self-pay | Admitting: Cardiology

## 2018-01-17 ENCOUNTER — Ambulatory Visit (INDEPENDENT_AMBULATORY_CARE_PROVIDER_SITE_OTHER): Payer: Medicare Other

## 2018-01-17 DIAGNOSIS — Z95810 Presence of automatic (implantable) cardiac defibrillator: Secondary | ICD-10-CM

## 2018-01-17 DIAGNOSIS — I5022 Chronic systolic (congestive) heart failure: Secondary | ICD-10-CM

## 2018-01-18 ENCOUNTER — Telehealth: Payer: Self-pay

## 2018-01-18 NOTE — Progress Notes (Signed)
EPIC Encounter for ICM Monitoring  Patient Name: ARISTIDES LUCKEY is a 84 y.o. male Date: 01/18/2018 Primary Care Physican: Pomposini, Cherly Anderson, MD Primary Cardiologist:Gary Sabra Heck, MD([facility]) Electrophysiologist: Allred Dry Weight:Previous weight 176lbs Bi-V Pacing: >99%  Battery Longevity: ~7.8        Attempted call to patient and unable to reach.    Transmission reviewed.    Thoracic impedance normal.  Prescribed: Furosemide 20 mg 1 tablet daily.   12/17/17 Furosemide was changed to PRN by [facility] Physician due to kidney function  Recommendations: NONE - Unable to reach.  Follow-up plan: ICM clinic phone appointment on 02/17/2018.    Copy of ICM check sent to Dr. Rayann Heman.   3 month ICM trend: 01/17/2018    1 Year ICM trend:       [name], RN 01/18/2018 4:04 PM

## 2018-01-18 NOTE — Telephone Encounter (Signed)
Remote ICM transmission received.  Attempted call to patient and left message, per DPR, to return call.  

## 2018-01-24 NOTE — Progress Notes (Signed)
Patient returned call.  He stated he is doing well.  Furosemide dosage is now 20 mg take 1 tablet twice a week.  He has no complaints today.  No changes.  Next ICM remote transmission scheduled 02/17/2018.

## 2018-01-31 LAB — CUP PACEART REMOTE DEVICE CHECK
Brady Statistic AP VP Percent: 6.5 %
Brady Statistic AP VS Percent: 1 %
Brady Statistic AS VS Percent: 1 %
Brady Statistic RA Percent Paced: 6.1 %
HighPow Impedance: 46 Ohm
Implantable Lead Implant Date: 20070102
Implantable Lead Location: 753860
Implantable Lead Model: 4194
Lead Channel Impedance Value: 380 Ohm
Lead Channel Impedance Value: 510 Ohm
Lead Channel Pacing Threshold Amplitude: 0.5 V
Lead Channel Pacing Threshold Amplitude: 1 V
Lead Channel Pacing Threshold Pulse Width: 0.5 ms
Lead Channel Pacing Threshold Pulse Width: 0.5 ms
Lead Channel Sensing Intrinsic Amplitude: 7.5 mV
Lead Channel Setting Pacing Amplitude: 2 V
Lead Channel Setting Pacing Amplitude: 2.5 V
Lead Channel Setting Pacing Pulse Width: 0.5 ms
MDC IDC LEAD IMPLANT DT: 20070102
MDC IDC LEAD IMPLANT DT: 20070102
MDC IDC LEAD LOCATION: 753858
MDC IDC LEAD LOCATION: 753859
MDC IDC MSMT BATTERY REMAINING LONGEVITY: 8 mo
MDC IDC MSMT BATTERY REMAINING PERCENTAGE: 9 %
MDC IDC MSMT BATTERY VOLTAGE: 2.66 V
MDC IDC MSMT LEADCHNL LV PACING THRESHOLD AMPLITUDE: 0.75 V
MDC IDC MSMT LEADCHNL RA IMPEDANCE VALUE: 510 Ohm
MDC IDC MSMT LEADCHNL RA PACING THRESHOLD PULSEWIDTH: 0.5 ms
MDC IDC MSMT LEADCHNL RA SENSING INTR AMPL: 2.5 mV
MDC IDC PG IMPLANT DT: 20120522
MDC IDC PG SERIAL: 643216
MDC IDC SESS DTM: 20190827110449
MDC IDC SET LEADCHNL LV PACING AMPLITUDE: 2 V
MDC IDC SET LEADCHNL RV PACING PULSEWIDTH: 0.5 ms
MDC IDC SET LEADCHNL RV SENSING SENSITIVITY: 2 mV
MDC IDC STAT BRADY AS VP PERCENT: 93 %

## 2018-02-17 ENCOUNTER — Ambulatory Visit (INDEPENDENT_AMBULATORY_CARE_PROVIDER_SITE_OTHER): Payer: Medicare Other

## 2018-02-17 DIAGNOSIS — I5022 Chronic systolic (congestive) heart failure: Secondary | ICD-10-CM

## 2018-02-17 DIAGNOSIS — Z95810 Presence of automatic (implantable) cardiac defibrillator: Secondary | ICD-10-CM | POA: Diagnosis not present

## 2018-02-17 NOTE — Progress Notes (Signed)
EPIC Encounter for ICM Monitoring  Patient Name: George Medina is a 84 y.o. male Date: 02/17/2018 Primary Care Physican: Pomposini, Cherly Anderson, MD Primary Cardiologist:Gary Sabra Heck, MD([facility]) Electrophysiologist: Allred Dry Weight:168lbs Bi-V Pacing: >99%  Battery Longevity: ~6.5 months           Heart Failure questions reviewed, pt asymptomatic.   Thoracic impedance abnormal suggesting fluid accumulation starting 02/10/2018 but is trending close to baseline.   Prescribed: Furosemide 20 mg 1 tablet daily.12/17/17 Furosemide was changed to PRN by [facility] Physician due to kidney function  Recommendations:  No changes.  Encouraged to call for fluid symptoms.  Follow-up plan: ICM clinic phone appointment on 03/01/2018 to recheck fluid levels.       Copy of ICM check sent to Dr. Rayann Heman.   3 month ICM trend: 02/17/2018    1 Year ICM trend:       [name], RN 02/17/2018 4:12 PM

## 2018-03-01 ENCOUNTER — Ambulatory Visit (INDEPENDENT_AMBULATORY_CARE_PROVIDER_SITE_OTHER): Payer: Medicare Other

## 2018-03-01 DIAGNOSIS — Z95810 Presence of automatic (implantable) cardiac defibrillator: Secondary | ICD-10-CM

## 2018-03-01 DIAGNOSIS — I5022 Chronic systolic (congestive) heart failure: Secondary | ICD-10-CM

## 2018-03-02 NOTE — Progress Notes (Signed)
EPIC Encounter for ICM Monitoring  Patient Name: George Medina is a 84 y.o. male Date: 03/02/2018 Primary Care Physican: Pomposini, Cherly Anderson, MD Primary Cardiologist:Gary Sabra Heck, MD([facility]) Electrophysiologist: Allred Dry Weight:168lbs Bi-V Pacing: >99%   Battery Longevity: ~5.7 months      Heart Failure questions reviewed, pt asymptomatic.   Thoracic impedance returned to normal since last remote transmission on 02/17/2018.    Prescribed: Furosemide 20 mg 1 tablet 1-2 times a week. He reported Dr Pomposini's office said he could take Furosemide 2 times a week.  Recommendations: No changes.  Encouraged to call for fluid symptoms.  Follow-up plan: ICM clinic phone appointment on 03/29/2018.   Office appointment scheduled 04/08/2018 with Dr. Rayann Heman.    Copy of ICM check sent to Dr. Rayann Heman.   3 month ICM trend: 03/01/2018    1 Year ICM trend:       [name], RN 03/02/2018 9:24 AM

## 2018-03-17 ENCOUNTER — Telehealth: Payer: Self-pay

## 2018-03-17 NOTE — Telephone Encounter (Signed)
Attempted return call to patient as requested by voice mail message asking who called him from Dr Jackalyn Lombard office and why.  Left message to return call.

## 2018-03-17 NOTE — Telephone Encounter (Signed)
Patient returned call and said the call was regarding changing the his appointment with Dr Rayann Heman from December to January.  No further assistance needed.

## 2018-03-29 ENCOUNTER — Ambulatory Visit (INDEPENDENT_AMBULATORY_CARE_PROVIDER_SITE_OTHER): Payer: Medicare Other

## 2018-03-29 ENCOUNTER — Telehealth: Payer: Self-pay

## 2018-03-29 DIAGNOSIS — I5022 Chronic systolic (congestive) heart failure: Secondary | ICD-10-CM

## 2018-03-29 DIAGNOSIS — Z95810 Presence of automatic (implantable) cardiac defibrillator: Secondary | ICD-10-CM

## 2018-03-29 DIAGNOSIS — I472 Ventricular tachycardia: Secondary | ICD-10-CM | POA: Diagnosis not present

## 2018-03-29 DIAGNOSIS — I4729 Other ventricular tachycardia: Secondary | ICD-10-CM

## 2018-03-29 NOTE — Telephone Encounter (Signed)
Remote ICM transmission received.  Attempted call to patient regarding ICM remote transmission and no answer.  

## 2018-03-29 NOTE — Progress Notes (Signed)
Remote ICD transmission.   

## 2018-03-29 NOTE — Progress Notes (Signed)
EPIC Encounter for ICM Monitoring  Patient Name: George Medina is a 84 y.o. male Date: 03/29/2018 Primary Care Physican: Pomposini, Cherly Anderson, MD Primary Cardiologist:Gary Sabra Heck, MD([facility]) Electrophysiologist: Allred Bi-V Pacing: >99%   Battery Longevity: ~5.4 months Last Weight: 168lbs Today's Weight: unknown       Attempted call to patient and unable to reach.    Thoracic impedance starting to trend below baseline today.    Prescribed: Furosemide 20 mg 1 tablet 1-2 times a week. He reported Dr Pomposini's office said he could take Furosemide 2 times a week.  Recommendations: Unable to reach.  Follow-up plan: ICM clinic phone appointment on 04/29/2018.       Copy of ICM check sent to Dr. Rayann Heman.   3 month ICM trend: 03/29/2018    1 Year ICM trend:       [name], RN 03/29/2018 9:07 AM

## 2018-04-08 ENCOUNTER — Encounter: Payer: Medicare Other | Admitting: Internal Medicine

## 2018-04-29 ENCOUNTER — Ambulatory Visit (INDEPENDENT_AMBULATORY_CARE_PROVIDER_SITE_OTHER): Payer: Medicare Other

## 2018-04-29 DIAGNOSIS — Z95810 Presence of automatic (implantable) cardiac defibrillator: Secondary | ICD-10-CM | POA: Diagnosis not present

## 2018-04-29 DIAGNOSIS — I5022 Chronic systolic (congestive) heart failure: Secondary | ICD-10-CM | POA: Diagnosis not present

## 2018-04-29 NOTE — Progress Notes (Signed)
EPIC Encounter for ICM Monitoring  Patient Name: JONTUE CRUMPACKER is a 84 y.o. male Date: 04/29/2018 Primary Care Physican: Pomposini, Cherly Anderson, MD Primary Cardiologist:Gary Sabra Heck, MD([facility]) Electrophysiologist: Allred Bi-V Pacing: >99% Battery Longevity: ~5.70months Last Weight: 168lbs Today's Weight: 168 lbs                                                   Heart failure questions reviewed.  He said he is feeling fine.   Thoracic impedance normal  Prescribed: Furosemide 20 mg 1 tablet1-2 times a week. He reported Dr Pomposini's office said he could take Furosemide 2 times a week.  Recommendations: Unable to reach.  Follow-up plan: ICM clinic phone appointment on 05/30/2018      Copy of ICM check sent to Dr. Rayann Heman.  3 month ICM trend: 04/29/2018    1 Year ICM trend:       [name], RN 04/29/2018 4:56 PM

## 2018-05-13 ENCOUNTER — Encounter: Payer: Medicare Other | Admitting: Internal Medicine

## 2018-05-20 LAB — CUP PACEART REMOTE DEVICE CHECK
Battery Remaining Longevity: 5 mo
Brady Statistic AP VS Percent: 1 %
Brady Statistic AS VS Percent: 1 %
Brady Statistic RA Percent Paced: 14 %
HIGH POWER IMPEDANCE MEASURED VALUE: 46 Ohm
Implantable Lead Location: 753859
Implantable Lead Location: 753860
Implantable Lead Model: 5076
Implantable Lead Model: 7001
Implantable Pulse Generator Implant Date: 20120522
Lead Channel Impedance Value: 390 Ohm
Lead Channel Impedance Value: 460 Ohm
Lead Channel Impedance Value: 590 Ohm
Lead Channel Pacing Threshold Amplitude: 0.5 V
Lead Channel Pacing Threshold Amplitude: 0.75 V
Lead Channel Pacing Threshold Amplitude: 1 V
Lead Channel Pacing Threshold Pulse Width: 0.5 ms
Lead Channel Sensing Intrinsic Amplitude: 11.8 mV
Lead Channel Sensing Intrinsic Amplitude: 2.2 mV
Lead Channel Setting Pacing Amplitude: 2 V
Lead Channel Setting Pacing Amplitude: 2 V
Lead Channel Setting Pacing Amplitude: 2.5 V
Lead Channel Setting Pacing Pulse Width: 0.5 ms
Lead Channel Setting Pacing Pulse Width: 0.5 ms
Lead Channel Setting Sensing Sensitivity: 2 mV
MDC IDC LEAD IMPLANT DT: 20070102
MDC IDC LEAD IMPLANT DT: 20070102
MDC IDC LEAD IMPLANT DT: 20070102
MDC IDC LEAD LOCATION: 753858
MDC IDC MSMT BATTERY REMAINING PERCENTAGE: 6 %
MDC IDC MSMT BATTERY VOLTAGE: 2.63 V
MDC IDC MSMT LEADCHNL LV PACING THRESHOLD PULSEWIDTH: 0.5 ms
MDC IDC MSMT LEADCHNL RV PACING THRESHOLD PULSEWIDTH: 0.5 ms
MDC IDC PG SERIAL: 643216
MDC IDC SESS DTM: 20191126093816
MDC IDC STAT BRADY AP VP PERCENT: 14 %
MDC IDC STAT BRADY AS VP PERCENT: 85 %

## 2018-06-15 ENCOUNTER — Ambulatory Visit (INDEPENDENT_AMBULATORY_CARE_PROVIDER_SITE_OTHER): Payer: Medicare Other

## 2018-06-15 DIAGNOSIS — Z95810 Presence of automatic (implantable) cardiac defibrillator: Secondary | ICD-10-CM

## 2018-06-15 DIAGNOSIS — I5022 Chronic systolic (congestive) heart failure: Secondary | ICD-10-CM | POA: Diagnosis not present

## 2018-06-17 ENCOUNTER — Telehealth: Payer: Self-pay

## 2018-06-17 NOTE — Telephone Encounter (Signed)
Spoke with patient to remind of missed remote transmission 

## 2018-06-17 NOTE — Progress Notes (Signed)
EPIC Encounter for ICM Monitoring  Patient Name: George Medina is a 85 y.o. male Date: 06/17/2018 Primary Care Physican: Pomposini, Cherly Anderson, MD Primary Cardiologist:Gary Sabra Heck, MD([facility]) Electrophysiologist: Allred Bi-V Pacing: >99% Battery Longevity: ~<16months Last Weight:168lbs Today's Weight: 168 lbs   Heart failure questions reviewed.  He said he is feeling fine.  Explained the battery is reaching replacement time and he will feel the device vibrate when it reaches that point. Advised to call the device clinic and he will not need to go to ER for the vibration.    Thoracic impedancenormal.  Prescribed:Furosemide 20 mg 1 tablet1-2 times a week. He reported Dr Pomposini's office said he could take Furosemide 2 times a week.  Recommendations: No changes and encouraged to call for fluid symptoms.   Follow-up plan: ICM clinic phone appointment on4/7/2020Office appt 07/08/2018 with Dr. Rayann Heman  Copy of ICM check sent to Dr.Allred  3 month ICM trend: 06/17/2018    1 Year ICM trend:       [name], RN 06/17/2018 2:53 PM

## 2018-06-28 ENCOUNTER — Ambulatory Visit: Payer: Medicare Other | Admitting: *Deleted

## 2018-06-29 ENCOUNTER — Ambulatory Visit (INDEPENDENT_AMBULATORY_CARE_PROVIDER_SITE_OTHER): Payer: Medicare Other | Admitting: *Deleted

## 2018-06-29 ENCOUNTER — Telehealth: Payer: Self-pay

## 2018-06-29 DIAGNOSIS — I472 Ventricular tachycardia: Secondary | ICD-10-CM | POA: Diagnosis not present

## 2018-06-29 DIAGNOSIS — I4729 Other ventricular tachycardia: Secondary | ICD-10-CM

## 2018-06-29 DIAGNOSIS — Z95810 Presence of automatic (implantable) cardiac defibrillator: Secondary | ICD-10-CM

## 2018-06-29 NOTE — Telephone Encounter (Signed)
Left message for patient to remind of missed remote transmission.  

## 2018-07-01 LAB — CUP PACEART REMOTE DEVICE CHECK
Battery Voltage: 2.6 V
Brady Statistic AP VP Percent: 17 %
Brady Statistic AP VS Percent: 1 %
Brady Statistic AS VP Percent: 82 %
Brady Statistic RA Percent Paced: 16 %
HighPow Impedance: 45 Ohm
Implantable Lead Implant Date: 20070102
Implantable Lead Implant Date: 20070102
Implantable Lead Location: 753858
Implantable Lead Model: 4194
Lead Channel Impedance Value: 540 Ohm
Lead Channel Pacing Threshold Amplitude: 0.75 V
Lead Channel Pacing Threshold Amplitude: 1 V
Lead Channel Pacing Threshold Pulse Width: 0.5 ms
Lead Channel Pacing Threshold Pulse Width: 0.5 ms
Lead Channel Setting Pacing Amplitude: 2 V
Lead Channel Setting Sensing Sensitivity: 2 mV
MDC IDC LEAD IMPLANT DT: 20070102
MDC IDC LEAD LOCATION: 753859
MDC IDC LEAD LOCATION: 753860
MDC IDC MSMT BATTERY REMAINING LONGEVITY: 1 mo
MDC IDC MSMT BATTERY REMAINING PERCENTAGE: 2 %
MDC IDC MSMT LEADCHNL LV IMPEDANCE VALUE: 330 Ohm
MDC IDC MSMT LEADCHNL RA IMPEDANCE VALUE: 450 Ohm
MDC IDC MSMT LEADCHNL RA PACING THRESHOLD AMPLITUDE: 0.5 V
MDC IDC MSMT LEADCHNL RA PACING THRESHOLD PULSEWIDTH: 0.5 ms
MDC IDC MSMT LEADCHNL RA SENSING INTR AMPL: 2.4 mV
MDC IDC MSMT LEADCHNL RV SENSING INTR AMPL: 11.8 mV
MDC IDC PG IMPLANT DT: 20120522
MDC IDC PG SERIAL: 643216
MDC IDC SESS DTM: 20200226161117
MDC IDC SET LEADCHNL LV PACING AMPLITUDE: 2 V
MDC IDC SET LEADCHNL LV PACING PULSEWIDTH: 0.5 ms
MDC IDC SET LEADCHNL RV PACING AMPLITUDE: 2.5 V
MDC IDC SET LEADCHNL RV PACING PULSEWIDTH: 0.5 ms
MDC IDC STAT BRADY AS VS PERCENT: 1 %

## 2018-07-03 DIAGNOSIS — I472 Ventricular tachycardia, unspecified: Secondary | ICD-10-CM

## 2018-07-03 HISTORY — DX: Ventricular tachycardia: I47.2

## 2018-07-03 HISTORY — DX: Ventricular tachycardia, unspecified: I47.20

## 2018-07-04 ENCOUNTER — Telehealth: Payer: Self-pay | Admitting: Nurse Practitioner

## 2018-07-04 NOTE — Telephone Encounter (Signed)
Merlin alert received for 10 episodes of VT terminated with ATP. All on 07/03/18 between 6:45PM and 7:30PM. Pt asymptomatic but did note that his sugar was very high during that time. He was recently admitted for pneumonia. He has been compliant with meds. Follow up already scheduled with Dr Rayann Heman on Friday which he will keep. He does not drive.  Chanetta Marshall, NP 07/04/2018 3:34 PM

## 2018-07-06 NOTE — Progress Notes (Signed)
Remote ICD transmission.   

## 2018-07-08 ENCOUNTER — Ambulatory Visit (INDEPENDENT_AMBULATORY_CARE_PROVIDER_SITE_OTHER): Payer: Medicare Other | Admitting: Internal Medicine

## 2018-07-08 ENCOUNTER — Encounter: Payer: Self-pay | Admitting: Internal Medicine

## 2018-07-08 VITALS — BP 144/63 | HR 64 | Ht 69.0 in | Wt 164.8 lb

## 2018-07-08 DIAGNOSIS — I5022 Chronic systolic (congestive) heart failure: Secondary | ICD-10-CM | POA: Diagnosis not present

## 2018-07-08 DIAGNOSIS — I472 Ventricular tachycardia, unspecified: Secondary | ICD-10-CM

## 2018-07-08 DIAGNOSIS — I442 Atrioventricular block, complete: Secondary | ICD-10-CM

## 2018-07-08 LAB — CUP PACEART INCLINIC DEVICE CHECK
Date Time Interrogation Session: 20200306094054
Implantable Lead Implant Date: 20070102
Implantable Lead Implant Date: 20070102
Implantable Lead Implant Date: 20070102
Implantable Lead Location: 753858
Implantable Lead Location: 753860
Implantable Lead Model: 4194
Implantable Lead Model: 5076
Implantable Lead Model: 7001
MDC IDC LEAD LOCATION: 753859
MDC IDC PG IMPLANT DT: 20120522
Pulse Gen Serial Number: 643216

## 2018-07-08 NOTE — Progress Notes (Signed)
PCP: Pomposini, Cherly Anderson, MD   Primary EP: Dr Rayann Heman  George Medina is a 85 y.o. male who presents today for routine electrophysiology followup.  Since last being seen in our clinic, the patient reports doing very well.  He reports having pneumonia for which he was hospitalized a week ago. He is recovering.  He had symptomatic VT 07/03/2018 which was ATP terminated.  Today, he denies symptoms of palpitations, chest pain, shortness of breath,  lower extremity edema, dizziness, presyncope, syncope, or ICD shocks.  The patient is otherwise without complaint today.   Past Medical History:  Diagnosis Date  . A-fib ([place])    anticoagulated with coumadin  . Brain aneurysm 1994   required surgery x2  . CHF (congestive heart failure) ([facility])    chronic-systolic now class I-II  . Complete heart block ([place])   . Diabetes mellitus   . Hypertension   . Nonischemic cardiomyopathy ([place])   . Nonsustained ventricular tachycardia ([place])    Past Surgical History:  Procedure Laterality Date  . CARDIAC DEFIBRILLATOR PLACEMENT     most recent gen change 5/12,  he has a SJM 7001 lead which was fluroscopically normal during generator change 5/12.  LV and RV leads were switched in the header  . CEREBRAL ANEURYSM REPAIR     x2    ROS- all systems are reviewed and negative except as per HPI above  Current Outpatient Medications  Medication Sig Dispense Refill  . albuterol (PROVENTIL HFA;VENTOLIN HFA) 108 (90 BASE) MCG/ACT inhaler Inhale 2 puffs into the lungs every 6 (six) hours as needed.      Marland Kitchen albuterol (PROVENTIL) (2.5 MG/3ML) 0.083% nebulizer solution Take 3 mLs by nebulization Once daily as needed.    Marland Kitchen aspirin EC 81 MG tablet Take 81 mg by mouth daily.    . carvedilol (COREG) 25 MG tablet Take 25 mg by mouth 2 (two) times daily with a meal.      . cholecalciferol (VITAMIN D) 1000 UNITS tablet Take 1,000 Units by mouth daily.      Marland Kitchen ezetimibe-simvastatin (VYTORIN) 10-40 MG tablet Take 0.5 tablets by  mouth daily.     . finasteride (PROSCAR) 5 MG tablet Take 5 mg by mouth daily.     . fluticasone (FLONASE) 50 MCG/ACT nasal spray Place 1 spray into the nose as needed.     . furosemide (LASIX) 20 MG tablet Take 20 mg by mouth daily. 03/02/18 Patient reported Furosemide is twice a week.    . levothyroxine (SYNTHROID, LEVOTHROID) 25 MCG tablet Take 1 tablet by mouth daily.    . ramipril (ALTACE) 10 MG capsule Take 10 mg by mouth daily.      . TRESIBA FLEXTOUCH 100 UNIT/ML SOPN FlexTouch Pen Inject 30 Units into the skin as directed.     No current facility-administered medications for this visit.     Physical Exam: Vitals:   07/08/18 1039  BP: (!) 144/63  Pulse: 64  SpO2: 94%  Weight: 164 lb 12.8 oz (74.8 kg)  Height: 5\' 9"  (1.753 m)    GEN- The patient is well appearing, alert and oriented x 3 today.   Head- normocephalic, atraumatic Eyes-  Sclera clear, conjunctiva pink Ears- hearing intact Oropharynx- clear Lungs- Clear to ausculation bilaterally, normal work of breathing Chest- ICD pocket is well healed Heart- Regular rate and rhythm, no murmurs, rubs or gallops, PMI not laterally displaced GI- soft, NT, ND, + BS Extremities- no clubbing, cyanosis, or edema  ICD interrogation-  reviewed in detail today,  See PACEART report  ekg tracing ordered today is personally reviewed and shows AV paced rhythm  Wt Readings from Last 3 Encounters:  07/08/18 164 lb 12.8 oz (74.8 kg)  04/09/17 166 lb (75.3 kg)  03/06/16 170 lb (77.1 kg)    Assessment and Plan:  1.  Chronic systolic dysfunction/ complete heart block euvolemic today Approaching ERI Will update echo Stable on an appropriate medical regimen Normal ICD function See Pace Art report No changes today  followed in ICM device clinic Risks, benefits, and alternatives to ICD pulse generator replacement were discussed in detail today.  The patient understands that risks include but are not limited to bleeding, infection,  pneumothorax, perforation, tamponade, vascular damage, renal failure, MI, stroke, death, inappropriate shocks, damage to his existing leads, and lead dislodgement and wishes to proceed once he reaches ERI.   2. VT VT with CL of 320 msec, successfully ATP terminated (x 10) 07/03/2018 with symptoms None since. Estimated battery longevity is <3 months  3. afib None in 5 years  4. HTN Stable No change required today  Merlin Return in 4 months If he reaches ERI, will be ok to go ahead and schedule generator change without office visit.  Thompson Grayer MD, [facility] 07/08/2018 11:17 AM

## 2018-07-08 NOTE — Addendum Note (Signed)
Addended by: Laurine Blazer on: 07/08/2018 11:41 AM   Modules accepted: Orders

## 2018-07-08 NOTE — Patient Instructions (Signed)
Medication Instructions:  Continue all current medications.  Labwork: none  Testing/Procedures: Your physician has requested that you have an echocardiogram. Echocardiography is a painless test that uses sound waves to create images of your heart. It provides your doctor with information about the size and shape of your heart and how well your heart's chambers and valves are working. This procedure takes approximately one hour. There are no restrictions for this procedure. Office will contact with results via phone or letter.     Follow-Up: 4 months   Any Other Special Instructions Will Be Listed Below (If Applicable).   If you need a refill on your cardiac medications before your next appointment, please call your pharmacy.  

## 2018-07-12 ENCOUNTER — Telehealth: Payer: Self-pay | Admitting: *Deleted

## 2018-07-12 NOTE — Telephone Encounter (Signed)
Spoke with patient regarding ICD at [facility] as of 07/11/18. He is aware that he doesn't need to be concerned if he feels the vibratory alert at this point. Per Dr. Jackalyn Lombard 07/08/18 OV note, gen change procedure can be scheduled without an additional OV. Pt is aware Sonia Baller, RN will contact him next week to schedule this procedure.  Pt reports he still feels weak and attributes this to recovering from pneumonia. Advised to call if his symptoms worsen. Pt denies fluid symptoms. Explained CorVue indicates he may actually be a little on the dry side but near baseline after his recent course of pneumonia. Pt verbalizes understanding and agrees to call if symptoms worsen.

## 2018-07-13 NOTE — Telephone Encounter (Signed)
Left message for daughter to call.  Chanetta Marshall, NP 07/13/2018 11:27 AM

## 2018-07-13 NOTE — Telephone Encounter (Signed)
Received telephone call from daughter -Boykin Peek in regards to a phone call from [place]. Will send message to [place].   [phone] -please call daughter.

## 2018-07-19 NOTE — Telephone Encounter (Signed)
Call placed to Pt.  Advised this nurse was aware he was ERI but because of current climate we would not be seeing him or scheduling him for a gen change yet.  Advised battery has 3-4 months left of life.  Pt thanked nurse for call.  Will cont to monitor.

## 2018-08-04 ENCOUNTER — Other Ambulatory Visit: Payer: Medicare Other

## 2018-08-09 ENCOUNTER — Other Ambulatory Visit: Payer: Self-pay

## 2018-08-09 ENCOUNTER — Ambulatory Visit (INDEPENDENT_AMBULATORY_CARE_PROVIDER_SITE_OTHER): Payer: Medicare Other

## 2018-08-09 DIAGNOSIS — I5022 Chronic systolic (congestive) heart failure: Secondary | ICD-10-CM | POA: Diagnosis not present

## 2018-08-09 DIAGNOSIS — Z95810 Presence of automatic (implantable) cardiac defibrillator: Secondary | ICD-10-CM | POA: Diagnosis not present

## 2018-08-10 ENCOUNTER — Telehealth: Payer: Self-pay

## 2018-08-10 NOTE — Telephone Encounter (Signed)
Printed and saving to spreadsheet

## 2018-08-10 NOTE — Telephone Encounter (Signed)
Remote ICM transmission received.  Attempted call to patient regarding ICM remote transmission and left detailed message, per DPR, with next ICM remote transmission date of 09/12/2018.  Advised to return call for any fluid symptoms or questions.

## 2018-08-10 NOTE — Progress Notes (Signed)
EPIC Encounter for ICM Monitoring  Patient Name: George Medina is a 85 y.o. male Date: 08/10/2018 Primary Care Physican: Pomposini, Cherly Anderson, MD Primary Cardiologist:Gary Sabra Heck, MD([facility]) Electrophysiologist: Allred Bi-V Pacing: 98% Last Weight:168 lbs  Battery ERI reached 07/11/2018 ( will be scheduled replacement in the next couple of months)   Attempted call to patient and unable to reach.  Left detailed message per DPR regarding transmission. Transmission reviewed.   Thoracic impedancenormal.  Prescribed:Furosemide 20 mg 1 tablet1-2 times a week. He reported Dr Pomposini's office said he could take Furosemide 2 times a week.  Recommendations: Left voice mail with ICM number and encouraged to call if experiencing any fluid symptoms.   Follow-up plan: ICM clinic phone appointment on5/03/2019  Copy of ICM check sent to Dr.Allred  3 month ICM trend: 08/09/2018    1 Year ICM trend:       [name], RN 08/10/2018 10:12 AM

## 2018-08-23 ENCOUNTER — Telehealth: Payer: Self-pay | Admitting: Internal Medicine

## 2018-08-23 NOTE — Telephone Encounter (Signed)
Spoke with patient. Questions answered.   Chanetta Marshall, NP 08/23/2018 3:47 PM

## 2018-08-23 NOTE — Telephone Encounter (Signed)
Pt wanting to talk to device nurse, says he has questions regarding ICD and the remote transmissions. Will forward to device clinic

## 2018-08-23 NOTE — Telephone Encounter (Signed)
Patient wants to know information about his defib and what he needs to do

## 2018-08-29 ENCOUNTER — Telehealth: Payer: Self-pay

## 2018-08-29 NOTE — Telephone Encounter (Signed)
Per Pt, unable to have procedure done this Thursday September 01, 2018.

## 2018-09-12 ENCOUNTER — Ambulatory Visit (INDEPENDENT_AMBULATORY_CARE_PROVIDER_SITE_OTHER): Payer: Medicare Other

## 2018-09-12 ENCOUNTER — Other Ambulatory Visit: Payer: Self-pay

## 2018-09-12 DIAGNOSIS — I5022 Chronic systolic (congestive) heart failure: Secondary | ICD-10-CM

## 2018-09-12 DIAGNOSIS — Z95810 Presence of automatic (implantable) cardiac defibrillator: Secondary | ICD-10-CM | POA: Diagnosis not present

## 2018-09-14 NOTE — Progress Notes (Signed)
EPIC Encounter for ICM Monitoring  Patient Name: George Medina is a 85 y.o. male Date: 09/14/2018 Primary Care Physican: Pomposini, Cherly Anderson, MD Primary Cardiologist:Gary Sabra Heck, MD([facility]) Electrophysiologist: Allred Bi-V Pacing: 99% Last Weight:168lbs  Battery ERI reached 07/11/2018 (will be scheduled replacement in the next couple of months)   Transmission reviewed.  Spoke with patient and he is doing well.  Advised patient to have transportation worked out for upcoming battery change and he has a couple of transportation options available.  CorVue Thoracic impedancenormal.  Prescribed:Furosemide 20 mg 1 tablet1-2 times a week. He reported Dr Pomposini's office said he could take Furosemide 2 times a week.  Recommendations:Encouraged to call if experiencing any fluid symptoms.  Follow-up plan: ICM clinic phone appointment on 10/17/2018 (advised pt if battery replacement occursprior to this date, will reschedule home monitoring for 6 weeks after procedure)  Copy of ICM check sent to Dr.Allred  3 month ICM trend: 09/12/2018    1 Year ICM trend:       [name], RN 09/14/2018 10:00 AM

## 2018-09-15 ENCOUNTER — Telehealth: Payer: Self-pay

## 2018-09-15 ENCOUNTER — Telehealth: Payer: Self-pay | Admitting: Internal Medicine

## 2018-09-15 NOTE — Telephone Encounter (Signed)
New Message    Pt would like for the nurse to call back. He says he is suppose to come in Tuesday morning to get batteries changed and he doesn't think he can make it    Please call

## 2018-09-15 NOTE — Telephone Encounter (Signed)
Duplicate message. 

## 2018-09-15 NOTE — Telephone Encounter (Signed)
Left message for Pt.  Will attempt to get Pt scheduled for generator change on Sep 20, 2018 at 9:00 am.  Left message to return this nurses call to discuss.

## 2018-09-15 NOTE — Telephone Encounter (Signed)
Call placed to Pt.  Pt scheduled for gen change on Sep 20 2018 at 9:00 am.  Pt will have labs and rapid covid screen upon arrival for procedure.  Following instruction given:  Please arrive to ADMITTING at the [facility] main entrance of [facility] hospital at:  6:00 am You will have your lab work and rapid covid test upon arrival Do not eat or drink after midnight prior to procedure Do not take any medications the morning of the procedure Your driver cannot accompany you.   You will be discharged after your procedure  You will need someone to drive you home at discharge  ADvised Pt to scrub chest with antibacterial soap night prior to procedure.  Pt does not have surgical scrub.

## 2018-09-15 NOTE — Telephone Encounter (Signed)
Patient called the [facility] office. He is trying to reach Willeen Cass, RN about his procedure tomorrow.

## 2018-09-20 ENCOUNTER — Ambulatory Visit (HOSPITAL_COMMUNITY)
Admission: RE | Admit: 2018-09-20 | Discharge: 2018-09-20 | Disposition: A | Discharge status: Home / Self Care | Payer: Medicare Other | Attending: Internal Medicine | Admitting: Internal Medicine

## 2018-09-20 ENCOUNTER — Other Ambulatory Visit: Payer: Self-pay

## 2018-09-20 ENCOUNTER — Encounter (HOSPITAL_COMMUNITY)
Admission: RE | Disposition: A | Discharge status: Home / Self Care | Payer: Self-pay | Source: Home / Self Care | Attending: Internal Medicine

## 2018-09-20 ENCOUNTER — Encounter (HOSPITAL_COMMUNITY): Payer: Self-pay | Admitting: Internal Medicine

## 2018-09-20 DIAGNOSIS — I509 Heart failure, unspecified: Secondary | ICD-10-CM | POA: Insufficient documentation

## 2018-09-20 DIAGNOSIS — I442 Atrioventricular block, complete: Secondary | ICD-10-CM | POA: Diagnosis not present

## 2018-09-20 DIAGNOSIS — Z79899 Other long term (current) drug therapy: Secondary | ICD-10-CM | POA: Insufficient documentation

## 2018-09-20 DIAGNOSIS — Z95810 Presence of automatic (implantable) cardiac defibrillator: Secondary | ICD-10-CM | POA: Insufficient documentation

## 2018-09-20 DIAGNOSIS — Z7982 Long term (current) use of aspirin: Secondary | ICD-10-CM | POA: Diagnosis not present

## 2018-09-20 DIAGNOSIS — Z1159 Encounter for screening for other viral diseases: Secondary | ICD-10-CM | POA: Diagnosis not present

## 2018-09-20 DIAGNOSIS — I472 Ventricular tachycardia: Secondary | ICD-10-CM | POA: Insufficient documentation

## 2018-09-20 DIAGNOSIS — I480 Paroxysmal atrial fibrillation: Secondary | ICD-10-CM | POA: Insufficient documentation

## 2018-09-20 DIAGNOSIS — Z7951 Long term (current) use of inhaled steroids: Secondary | ICD-10-CM | POA: Insufficient documentation

## 2018-09-20 DIAGNOSIS — E119 Type 2 diabetes mellitus without complications: Secondary | ICD-10-CM | POA: Diagnosis not present

## 2018-09-20 DIAGNOSIS — I428 Other cardiomyopathies: Secondary | ICD-10-CM | POA: Diagnosis not present

## 2018-09-20 DIAGNOSIS — Z7901 Long term (current) use of anticoagulants: Secondary | ICD-10-CM | POA: Insufficient documentation

## 2018-09-20 DIAGNOSIS — Z4502 Encounter for adjustment and management of automatic implantable cardiac defibrillator: Secondary | ICD-10-CM | POA: Diagnosis not present

## 2018-09-20 DIAGNOSIS — I110 Hypertensive heart disease with heart failure: Secondary | ICD-10-CM | POA: Insufficient documentation

## 2018-09-20 HISTORY — PX: BIV ICD GENERATOR CHANGEOUT: EP1194

## 2018-09-20 LAB — CBC
HCT: 41 % (ref 39.0–52.0)
Hemoglobin: 12.7 g/dL — ABNORMAL LOW (ref 13.0–17.0)
MCH: 27 pg (ref 26.0–34.0)
MCHC: 31 g/dL (ref 30.0–36.0)
MCV: 87 fL (ref 80.0–100.0)
Platelets: 299 10*3/uL (ref 150–400)
RBC: 4.71 MIL/uL (ref 4.22–5.81)
RDW: 13.9 % (ref 11.5–15.5)
WBC: 16.1 10*3/uL — ABNORMAL HIGH (ref 4.0–10.5)
nRBC: 0 % (ref 0.0–0.2)

## 2018-09-20 LAB — GLUCOSE, CAPILLARY
Glucose-Capillary: 69 mg/dL — ABNORMAL LOW (ref 70–99)
Glucose-Capillary: 95 mg/dL (ref 70–99)
Glucose-Capillary: 96 mg/dL (ref 70–99)
Glucose-Capillary: 124 mg/dL — ABNORMAL HIGH (ref 70–99)

## 2018-09-20 LAB — BASIC METABOLIC PANEL
Anion gap: 11 (ref 5–15)
BUN: 27 mg/dL — ABNORMAL HIGH (ref 8–23)
CO2: 24 mmol/L (ref 22–32)
Calcium: 9.5 mg/dL (ref 8.9–10.3)
Chloride: 106 mmol/L (ref 98–111)
Creatinine, Ser: 1.51 mg/dL — ABNORMAL HIGH (ref 0.61–1.24)
GFR calc Af Amer: 48 mL/min — ABNORMAL LOW (ref >60)
GFR calc non Af Amer: 41 mL/min — ABNORMAL LOW (ref >60)
Glucose, Bld: 93 mg/dL (ref 70–99)
Potassium: 4.1 mmol/L (ref 3.5–5.1)
Sodium: 141 mmol/L (ref 135–145)

## 2018-09-20 LAB — SARS CORONAVIRUS 2 BY RT PCR (HOSPITAL ORDER, PERFORMED IN ~~LOC~~ HOSPITAL LAB): SARS Coronavirus 2: NEGATIVE

## 2018-09-20 LAB — SURGICAL PCR SCREEN
MRSA, PCR: NEGATIVE
Staphylococcus aureus: NEGATIVE

## 2018-09-20 SURGERY — BIV ICD GENERATOR CHANGEOUT
Anesthesia: LOCAL

## 2018-09-20 MED ORDER — SODIUM CHLORIDE 0.9 % IV SOLN: 250.0000 mL | INTRAVENOUS | Status: DC | PRN

## 2018-09-20 MED ORDER — SODIUM CHLORIDE 0.9% FLUSH: 3.0000 mL | INTRAVENOUS | Status: DC | PRN

## 2018-09-20 MED ORDER — MUPIROCIN 2 % EX OINT
TOPICAL_OINTMENT | CUTANEOUS | Status: AC
  Administered 2018-09-20: 07:00:00
  Filled 2018-09-20: qty 22

## 2018-09-20 MED ORDER — SODIUM CHLORIDE 0.9 % IV SOLN
80.0000 mg | INTRAVENOUS | Status: AC
  Administered 2018-09-20: 80 mg
  Filled 2018-09-20: qty 2

## 2018-09-20 MED ORDER — SODIUM CHLORIDE 0.9 % IV SOLN
INTRAVENOUS | Status: AC
  Filled 2018-09-20: qty 2

## 2018-09-20 MED ORDER — VANCOMYCIN HCL IN DEXTROSE 1-5 GM/200ML-% IV SOLN
1000.0000 mg | INTRAVENOUS | Status: AC
  Administered 2018-09-20: 1000 mg via INTRAVENOUS
  Filled 2018-09-20: qty 200

## 2018-09-20 MED ORDER — SODIUM CHLORIDE 0.9 % IV SOLN
INTRAVENOUS | Status: DC
  Administered 2018-09-20: 07:00:00 via INTRAVENOUS

## 2018-09-20 MED ORDER — LIDOCAINE HCL (PF) 1 % IJ SOLN
INTRAMUSCULAR | Status: DC | PRN
  Administered 2018-09-20: 60 mL

## 2018-09-20 MED ORDER — VANCOMYCIN HCL IN DEXTROSE 1-5 GM/200ML-% IV SOLN
INTRAVENOUS | Status: AC
  Filled 2018-09-20: qty 200

## 2018-09-20 MED ORDER — LIDOCAINE HCL (PF) 1 % IJ SOLN
INTRAMUSCULAR | Status: AC
  Filled 2018-09-20: qty 60

## 2018-09-20 MED ORDER — ACETAMINOPHEN 325 MG PO TABS: 325.0000 mg | ORAL_TABLET | ORAL | Status: DC | PRN

## 2018-09-20 MED ORDER — ONDANSETRON HCL 4 MG/2ML IJ SOLN: 4.0000 mg | Freq: Four times a day (QID) | INTRAMUSCULAR | Status: DC | PRN

## 2018-09-20 MED ORDER — SODIUM CHLORIDE 0.9% FLUSH: 3.0000 mL | Freq: Two times a day (BID) | INTRAVENOUS | Status: DC

## 2018-09-20 SURGICAL SUPPLY — 4 items
CABLE SURGICAL S-101-97-12 (CABLE) ×2 IMPLANT
ICD UNIFY ASURA CRT CD3357-40C (ICD Generator) ×1 IMPLANT
PAD PRO RADIOLUCENT 2001M-C (PAD) ×2 IMPLANT
TRAY PACEMAKER INSERTION (PACKS) ×2 IMPLANT

## 2018-09-20 NOTE — Progress Notes (Signed)
Discharge instructions reviewed with pt and Roda (friend) voices understanding.

## 2018-09-20 NOTE — Progress Notes (Signed)
Dr Rayann Heman called about EKG reading. States its ok to discharge pt

## 2018-09-20 NOTE — Discharge Instructions (Signed)
Post procedure care instructions Keep incision clean and dry for 10 days. No driving for 2 days.  You can remove outer dressing tomorrow. Leave steri-strips (little pieces of tape) on until seen in the office for wound check appointment. Call the office [phone]) for redness, drainage, swelling, or fever.   Cardioverter Defibrillator Implantation, Care After This sheet gives you information about how to care for yourself after your procedure. Your health care provider may also give you more specific instructions. If you have problems or questions, contact your health care provider. What can I expect after the procedure? After the procedure, it is common to have:  Some pain. It may last a few days.  A slight bump over the skin where the device was placed. Sometimes, it is possible to feel the device under the skin. This is normal. During the months and years after your procedure, your health care provider will check the device, the leads, and the battery every few months. Eventually, when the battery is low, the device will be replaced. Follow these instructions at home: Medicines  Take over-the-counter and prescription medicines only as told by your health care provider.  If you were prescribed an antibiotic medicine, take it as told by your health care provider. Do not stop taking the antibiotic even if you start to feel better. Incision care      Follow instructions from your health care provider about how to take care of your incision area. Make sure you: ? Wash your hands with soap and water before you change your bandage (dressing). If soap and water are not available, use hand sanitizer. ? Change your dressing as told by your health care provider. ? Leave stitches (sutures), skin glue, or adhesive strips in place. These skin closures may need to stay in place for 2 weeks or longer. If adhesive strip edges start to loosen and curl up, you may trim the loose edges. Do not remove  adhesive strips completely unless your health care provider tells you to do that.  Check your incision area every day for signs of infection. Check for: ? More redness, swelling, or pain. ? More fluid or blood. ? Warmth. ? Pus or a bad smell.  Do not use lotions or ointments near the incision area unless told by your health care provider.  Keep the incision area clean and dry for 2-3 days after the procedure or for as long as told by your health care provider. It takes several weeks for the incision site to heal completely.  Do not take baths, swim, or use a hot tub until your health care provider approves. Activity  Try to walk a little every day. Exercising is important after this procedure. Also, use your shoulder on the side of the defibrillator in daily tasks that do not require a lot of motion.  For at least 6 weeks: ? Do not lift your upper arm above your shoulders. This means no tennis, golf, or swimming for this period of time. If you tend to sleep with your arm above your head, use a restraint to prevent this during sleep. ? Avoid sudden jerking, pulling, or chopping movements that pull your upper arm far away from your body.  Ask your health care provider when you may go back to work.  Check with your health care provider before you start to drive or play sports. Electric and magnetic fields  Tell all health care providers that you have a defibrillator. This may prevent them from giving  you an MRI scan because strong magnets are used for that test.  If you must pass through a metal detector, quickly walk through it. Do not stop under the detector, and do not stand near it.  Avoid places or objects that have a strong electric or magnetic field, including: ? Airport Herbalist. At the airport, let officials know that you have a defibrillator. Your defibrillator ID card will let you be checked in a way that is safe for you and will not damage your defibrillator. Also, do  not let a security person wave a magnetic wand near your defibrillator. That can make it stop working. ? Power plants. ? Large electrical generators. ? Anti-theft systems or electronic article surveillance (EAS). ? Radiofrequency transmission towers, such as cell phone and radio towers.  Do not use amateur (ham) radio equipment or electric (arc) welding torches. Some devices are safe to use if held at least 12 inches (30 cm) from your defibrillator. These include power tools, lawn mowers, and speakers. If you are unsure if something is safe to use, ask your health care provider.  Do not use MP3 player headphones. They have magnets.  You may safely use electric blankets, heating pads, computers, and microwave ovens.  When using your cell phone, hold it to the ear that is on the opposite side from the defibrillator. Do not leave your cell phone in a pocket over the defibrillator. General instructions  Follow diet instructions from your health care provider, if this applies.  Always keep your defibrillator ID card with you. The card should list the implant date, device model, and manufacturer. Consider wearing a medical alert bracelet or necklace.  Have your defibrillator checked every 3-6 months or as often as told by your health care provider. Most defibrillators last for 4-8 years.  Keep all follow-up visits as told by your health care provider. This is important for your health care provider to make sure your chest is healing the way it should. Ask your health care provider when you should come back to have your stitches or staples taken out. Contact a health care provider if:  You feel one shock in your chest.  You gain weight suddenly.  Your legs or feet swell more than they have before.  It feels like your heart is fluttering or skipping beats (heart palpitations).  You have more redness, swelling, or pain around your incision.  You have more fluid or blood coming from your  incision.  Your incision feels warm to the touch.  You have pus or a bad smell coming from your incision.  You have a fever. Get help right away if:  You have chest pain.  You feel more than one shock.  You feel more short of breath than you have felt before.  You feel more light-headed than you have felt before.  Your incision starts to open up. This information is not intended to replace advice given to you by your health care provider. Make sure you discuss any questions you have with your health care provider. Document Released: 11/07/2004 Document Revised: 07/09/2016 Document Reviewed: 09/25/2015 Elsevier Interactive Patient Education  2019 [employer].

## 2018-09-20 NOTE — H&P (Signed)
ATHOL BOLDS is a 85 y.o. male who presents today for BiV ICD generator change.  Since last being seen in our clinic, the patient reports doing very well.   He had symptomatic VT 07/03/2018 which was ATP terminated.  Today, he denies symptoms of palpitations, chest pain, shortness of breath,  lower extremity edema, dizziness, presyncope, syncope, or ICD shocks.  The patient is otherwise without complaint today.       Past Medical History:  Diagnosis Date  . A-fib ([place])    anticoagulated with coumadin  . Brain aneurysm 1994   required surgery x2  . CHF (congestive heart failure) (H[facility])    chronic-systolic now class I-II  . Complete heart block ([place])   . Diabetes mellitus   . Hypertension   . Nonischemic cardiomyopathy ([place])   . Nonsustained ventricular tachycardia ([place])    Past Surgical History:  Procedure Laterality Date  . CARDIAC DEFIBRILLATOR PLACEMENT     most recent gen change 5/12,  he has a SJM 7001 lead which was fluroscopically normal during generator change 5/12.  LV and RV leads were switched in the header  . CEREBRAL ANEURYSM REPAIR     x2    ROS- all systems are reviewed and negative except as per HPI above        Current Outpatient Medications  Medication Sig Dispense Refill  . albuterol (PROVENTIL HFA;VENTOLIN HFA) 108 (90 BASE) MCG/ACT inhaler Inhale 2 puffs into the lungs every 6 (six) hours as needed.      Marland Kitchen albuterol (PROVENTIL) (2.5 MG/3ML) 0.083% nebulizer solution Take 3 mLs by nebulization Once daily as needed.    Marland Kitchen aspirin EC 81 MG tablet Take 81 mg by mouth daily.    . carvedilol (COREG) 25 MG tablet Take 25 mg by mouth 2 (two) times daily with a meal.      . cholecalciferol (VITAMIN D) 1000 UNITS tablet Take 1,000 Units by mouth daily.      Marland Kitchen ezetimibe-simvastatin (VYTORIN) 10-40 MG tablet Take 0.5 tablets by mouth daily.     . finasteride (PROSCAR) 5 MG tablet Take 5 mg by mouth daily.     . fluticasone (FLONASE) 50  MCG/ACT nasal spray Place 1 spray into the nose as needed.     . furosemide (LASIX) 20 MG tablet Take 20 mg by mouth daily. 03/02/18 Patient reported Furosemide is twice a week.    . levothyroxine (SYNTHROID, LEVOTHROID) 25 MCG tablet Take 1 tablet by mouth daily.    . ramipril (ALTACE) 10 MG capsule Take 10 mg by mouth daily.      . TRESIBA FLEXTOUCH 100 UNIT/ML SOPN FlexTouch Pen Inject 30 Units into the skin as directed.     No current facility-administered medications for this visit.     Physical Exam: Vitals:   09/20/18 0559  BP: (!) 137/58  Pulse: 72  Resp: 16  Temp: 98 F (36.7 C)  SpO2: 96%    GEN- The patient is well appearing, alert and oriented x 3 today.   Head- normocephalic, atraumatic Eyes-  Sclera clear, conjunctiva pink Ears- hearing intact Oropharynx- clear Lungs- Clear to ausculation bilaterally, normal work of breathing Chest- ICD pocket is well healed Heart- Regular rate and rhythm, no murmurs, rubs or gallops, PMI not laterally displaced GI- soft, NT, ND, + BS Extremities- no clubbing, cyanosis, or edema  ICD interrogation- reviewed in detail today,  See PACEART report  ekg tracing ordered today is personally reviewed and shows AV paced rhythm  Wt Readings from Last 3 Encounters:  07/08/18 164 lb 12.8 oz (74.8 kg)  04/09/17 166 lb (75.3 kg)  03/06/16 170 lb (77.1 kg)    Assessment and Plan:  1.  Chronic systolic dysfunction/ complete heart block euvolemic today He has reached ERI battery status Echo could not be updated to due social distancing of COVID 19 Stable on an appropriate medical regimen His EF has previously normalized with CRT. He has a SJM 7001 lead which is functioning normally.  In 2012, his LV and RV ports were switched in the header. Risks, benefits, and alternatives to BIV ICD pulse generator replacement were discussed in detail today.  The patient understands that risks include but are not limited to  bleeding, infection, pneumothorax, perforation, tamponade, vascular damage, renal failure, MI, stroke, death, inappropriate shocks, damage to his existing leads, and lead dislodgement and wishes to proceed.  2. VT VT with CL of 320 msec, successfully ATP terminated (x 10) 07/03/2018 with symptoms None since. Estimated battery longevity is <3 months  3. afib None in 5 years  ICD Criteria  Current LVEF:50%. Within 12 months prior to implant: No   Heart failure history: Yes, Class III  Cardiomyopathy history: Yes, Non-Ischemic Cardiomyopathy.  Atrial Fibrillation/Atrial Flutter: Yes, Paroxysmal.  Ventricular tachycardia history: Yes, Hemodynamic instability present. VT Type: Sustained Ventricular Tachycardia - Monomorphic.  Cardiac arrest history: No.  History of syndromes with risk of sudden death: No.  Previous ICD: Yes, Reason for ICD:  Primary prevention.  Current ICD indication: Secondary  PPM indication: Yes. Pacing type: Ventricular. Greater than 40% RV pacing requirement anticipated. Indication: Complete Heart Block  Class I or II Bradycardia indication present: Yes  Beta Blocker therapy for 3 or more months: Yes, prescribed.   Ace Inhibitor/ARB therapy for 3 or more months: Yes, prescribed.    I have seen NATHANIEL WAKELEY is a 85 y.o. male presents for consideration of BiV ICD generator change for secondary prevention of sudden death.  [name] chart has been reviewed and they meet criteria for ICD implant.  I have had a thorough discussion with the patient reviewing options.  The patient has had opportunities to ask questions and have them answered. The patient and I have decided together through the [place] Decision Support Tool to proceed with BiV ICD generator change at this time.  Risks, benefits, alternatives to BiV generator change ICD implantation were discussed in detail with the patient today. The patient  understands that the risks include but  are not limited to bleeding, infection, pneumothorax, perforation, tamponade, vascular damage, renal failure, MI, stroke, death, inappropriate shocks, and lead dislodgement and wishes to proceed.    Thompson Grayer MD, [place] 09/20/2018 8:27 AM

## 2018-09-21 ENCOUNTER — Telehealth: Payer: Self-pay | Admitting: Internal Medicine

## 2018-09-21 NOTE — Telephone Encounter (Signed)
Attempted to call pt. No answer, left message requesting call back.

## 2018-09-21 NOTE — Telephone Encounter (Signed)
New Message   Patient states he has pacemaker placed yesterday morning and wants to know when should he remove the tape.

## 2018-09-22 NOTE — Telephone Encounter (Addendum)
Spoke with patient. He removed outer occlusive dressing as instructed on d/c instructions. He is aware to keep Steri-strips in place. Confirmed wound check appointment on 09/29/18 at 2:00pm. Patient aware of office address. He denies questions or concerns at this time and thanked me for my call.

## 2018-09-29 ENCOUNTER — Ambulatory Visit: Payer: Medicare Other

## 2018-10-03 ENCOUNTER — Telehealth: Payer: Self-pay | Admitting: Internal Medicine

## 2018-10-03 ENCOUNTER — Telehealth: Payer: Self-pay

## 2018-10-03 NOTE — Telephone Encounter (Signed)
New Message   Patient's daughter calling to see if she could get her fathers appt rescheduled [place] stating [place] is hard for her to get to with father.  Advised patient Dr. Rayann Heman doesn't have any open appointments in [place].  Patient would like to speak to a nurse.

## 2018-10-03 NOTE — Telephone Encounter (Signed)
Spoke to pt daughter Maudie Mercury, informed her that [facility] does not have facilities in [place]. Kim declined virtual visit and agreed to come to [place] location. Provided address. No further questions at this time.

## 2018-10-03 NOTE — Telephone Encounter (Signed)
    COVID-19 Pre-Screening Questions:  . In the past 7 to 10 days have you had a cough,  shortness of breath, headache, congestion, fever (100 or greater) body aches, chills, sore throat, or sudden loss of taste or sense of smell? . Have you been around anyone with known Covid 19. . Have you been around anyone who is awaiting Covid 19 test results in the past 7 to 10 days? . Have you been around anyone who has been exposed to Covid 19, or has mentioned symptoms of Covid 19 within the past 7 to 10 days?  If you have any concerns/questions about symptoms patients report during screening (either on the phone or at threshold). Contact the provider seeing the patient or DOD for further guidance.  If neither are available contact a member of the leadership team.          Pt answered no to all questions, encouraged to wear his own mask to appt.

## 2018-10-06 ENCOUNTER — Ambulatory Visit (INDEPENDENT_AMBULATORY_CARE_PROVIDER_SITE_OTHER): Payer: Medicare Other | Admitting: Student

## 2018-10-06 ENCOUNTER — Other Ambulatory Visit: Payer: Self-pay

## 2018-10-06 DIAGNOSIS — I5022 Chronic systolic (congestive) heart failure: Secondary | ICD-10-CM

## 2018-10-06 DIAGNOSIS — Z95810 Presence of automatic (implantable) cardiac defibrillator: Secondary | ICD-10-CM

## 2018-10-06 DIAGNOSIS — I442 Atrioventricular block, complete: Secondary | ICD-10-CM

## 2018-10-06 LAB — CUP PACEART INCLINIC DEVICE CHECK
Battery Remaining Longevity: 82 mo
Brady Statistic RA Percent Paced: 3 %
Brady Statistic RV Percent Paced: 99.87 %
Date Time Interrogation Session: 20200604142811
HighPow Impedance: 51.4035
Implantable Lead Implant Date: 20070102
Implantable Lead Implant Date: 20070102
Implantable Lead Implant Date: 20070102
Implantable Lead Location: 753858
Implantable Lead Location: 753859
Implantable Lead Location: 753860
Implantable Lead Model: 4194
Implantable Lead Model: 5076
Implantable Lead Model: 7001
Implantable Pulse Generator Implant Date: 20200519
Lead Channel Impedance Value: 425 Ohm
Lead Channel Impedance Value: 450 Ohm
Lead Channel Impedance Value: 562.5 Ohm
Lead Channel Pacing Threshold Amplitude: 0.5 V
Lead Channel Pacing Threshold Amplitude: 0.5 V
Lead Channel Pacing Threshold Amplitude: 0.75 V
Lead Channel Pacing Threshold Amplitude: 0.75 V
Lead Channel Pacing Threshold Amplitude: 1 V
Lead Channel Pacing Threshold Amplitude: 1 V
Lead Channel Pacing Threshold Pulse Width: 0.5 ms
Lead Channel Pacing Threshold Pulse Width: 0.5 ms
Lead Channel Pacing Threshold Pulse Width: 0.5 ms
Lead Channel Pacing Threshold Pulse Width: 0.5 ms
Lead Channel Pacing Threshold Pulse Width: 0.5 ms
Lead Channel Pacing Threshold Pulse Width: 0.5 ms
Lead Channel Sensing Intrinsic Amplitude: 3.9 mV
Lead Channel Setting Pacing Amplitude: 2 V
Lead Channel Setting Pacing Amplitude: 2 V
Lead Channel Setting Pacing Amplitude: 2.5 V
Lead Channel Setting Pacing Pulse Width: 0.5 ms
Lead Channel Setting Pacing Pulse Width: 0.5 ms
Lead Channel Setting Sensing Sensitivity: 2 mV
Pulse Gen Serial Number: 9861706

## 2018-10-06 NOTE — Progress Notes (Signed)
CRT-D device check in office. Thresholds and sensing consistent with previous device measurements. RV lead impedence trending upwards slightly. Received alert for impedence > 130. Measurements at 131 and 132. Alert changed to alarm for impedence > 200. No mode switch episodes recorded. Pt had 2 VHR episodes that were 1:1 AF/AT. Pt has known PAF. Burden < 0.1%.  Patient bi-ventricularly pacing 96.78% of the time. Device programmed with appropriate safety margins. Heart failure diagnostics reviewed and trends are stable for patient. Estimated longevity 2 yr, 9 mo.  Patient enrolled in remote follow up. Will check device remotely in 1 month to follow trend.   Legrand Como [address]" [place], PA-C 10/06/2018 2:28 PM

## 2018-10-18 ENCOUNTER — Telehealth: Payer: Self-pay

## 2018-10-18 NOTE — Telephone Encounter (Signed)
Unable to speak  with patient to remind of missed remote transmission 

## 2018-10-31 NOTE — Progress Notes (Signed)
No ICM remote transmission received for 10/17/2018 and next ICM transmission scheduled for 11/09/2018.

## 2018-11-09 ENCOUNTER — Ambulatory Visit (INDEPENDENT_AMBULATORY_CARE_PROVIDER_SITE_OTHER): Payer: Medicare Other

## 2018-11-09 DIAGNOSIS — Z95810 Presence of automatic (implantable) cardiac defibrillator: Secondary | ICD-10-CM | POA: Diagnosis not present

## 2018-11-09 DIAGNOSIS — I5022 Chronic systolic (congestive) heart failure: Secondary | ICD-10-CM

## 2018-11-09 NOTE — Progress Notes (Signed)
EPIC Encounter for ICM Monitoring  Patient Name: George Medina is a 85 y.o. male Date: 11/09/2018 Primary Care Physican: Pomposini, Cherly Anderson, MD Primary Cardiologist:Gary Sabra Heck, MD([facility]) Electrophysiologist: Allred Bi-V Pacing: 99% LastWeight:168lbs   Transmission reviewed.   CorVue Thoracic impedancenormal.  Prescribed:Furosemide 20 mg 1 tablet1-2 times a week. He reported Dr Pomposini's office said he could take Furosemide 2 times a week.  Recommendations:None  Follow-up plan: ICM clinic phone appointment on 12/12/2018  Copy of ICM check sent to Dr.Allred  3 month ICM trend: 11/09/2018    1 Year ICM trend:       [name], RN 11/09/2018 10:38 AM

## 2018-11-11 ENCOUNTER — Encounter: Payer: Medicare Other | Admitting: Internal Medicine

## 2018-12-02 ENCOUNTER — Encounter: Payer: Medicare Other | Admitting: Internal Medicine

## 2018-12-12 ENCOUNTER — Ambulatory Visit (INDEPENDENT_AMBULATORY_CARE_PROVIDER_SITE_OTHER): Payer: Medicare Other

## 2018-12-12 DIAGNOSIS — I5022 Chronic systolic (congestive) heart failure: Secondary | ICD-10-CM

## 2018-12-12 DIAGNOSIS — Z95810 Presence of automatic (implantable) cardiac defibrillator: Secondary | ICD-10-CM

## 2018-12-12 NOTE — Progress Notes (Signed)
EPIC Encounter for ICM Monitoring  Patient Name: George Medina is a 85 y.o. male Date: 12/12/2018 Primary Care Physican: Medina, George Anderson, MD Primary Cardiologist:George Sabra Heck, MD([facility]) Electrophysiologist: George Medina Bi-V Pacing: 99% LastWeight:168lbs   Spoke with patient.  He denies any fluid symptoms and feels fine.  He has PCP appt tomorrow with labs.  CorVueThoracic impedancenormal.  Prescribed:Furosemide 20 mg 1 tablet1-2 times a week. He reported Dr Medina's office said he could take Furosemide 2 times a week.  Recommendations: Advised to take PRN Furosemide 1 tablet daily x 2 days and then return to PRN.  Follow-up plan: ICM clinic phone appointment on8/18/2020 to recheck fluid levels.  Copy of ICM check sent to Dr.Allred  3 month ICM trend: 12/12/2018    1 Year ICM trend:       George Billings, RN 12/12/2018 3:14 PM

## 2018-12-20 ENCOUNTER — Ambulatory Visit (INDEPENDENT_AMBULATORY_CARE_PROVIDER_SITE_OTHER): Payer: Medicare Other

## 2018-12-20 DIAGNOSIS — I5022 Chronic systolic (congestive) heart failure: Secondary | ICD-10-CM

## 2018-12-20 DIAGNOSIS — Z95810 Presence of automatic (implantable) cardiac defibrillator: Secondary | ICD-10-CM

## 2018-12-20 NOTE — Progress Notes (Signed)
.  EPIC Encounter for ICM Monitoring  Patient Name: George Medina is a 85 y.o. male Date: 12/20/2018 Primary Care Physican: Pomposini, Cherly Anderson, MD [place], MD([facility]) Electrophysiologist: Allred Bi-V Pacing: 99% LastWeight:168lbs   Spoke with patient.  He denies any fluid symptoms.  CorVueThoracic impedancereturned to normal after taking PRN Lasix.  Prescribed:Furosemide 20 mg 1 tablet1-2 times a week. He reported Dr Pomposini's office said he could take Furosemide 2 times a week.  Recommendations: No changes and encouraged to call if experiencing any fluid symptoms.  Follow-up plan: ICM clinic phone appointment on10/09/2018.OV with Dr Rayann Heman 01/06/2019.  Copy of ICM check sent to Dr.Allred  3 month ICM trend: 12/20/2018    1 Year ICM trend:       [name], RN 12/20/2018 11:20 AM

## 2019-01-06 ENCOUNTER — Ambulatory Visit (INDEPENDENT_AMBULATORY_CARE_PROVIDER_SITE_OTHER): Payer: Medicare Other | Admitting: *Deleted

## 2019-01-06 ENCOUNTER — Telehealth (INDEPENDENT_AMBULATORY_CARE_PROVIDER_SITE_OTHER): Payer: Medicare Other | Admitting: Internal Medicine

## 2019-01-06 ENCOUNTER — Encounter: Payer: Self-pay | Admitting: Internal Medicine

## 2019-01-06 DIAGNOSIS — I472 Ventricular tachycardia, unspecified: Secondary | ICD-10-CM

## 2019-01-06 DIAGNOSIS — I5022 Chronic systolic (congestive) heart failure: Secondary | ICD-10-CM | POA: Diagnosis not present

## 2019-01-06 DIAGNOSIS — I442 Atrioventricular block, complete: Secondary | ICD-10-CM

## 2019-01-06 LAB — CUP PACEART REMOTE DEVICE CHECK
Battery Remaining Longevity: 78 mo
Battery Remaining Percentage: 92 %
Battery Voltage: 3.13 V
Brady Statistic AP VP Percent: 1.4 %
Brady Statistic AP VS Percent: 1 %
Brady Statistic AS VP Percent: 98 %
Brady Statistic AS VS Percent: 1 %
Brady Statistic RA Percent Paced: 1.2 %
Date Time Interrogation Session: 20200904060015
HighPow Impedance: 48 Ohm
HighPow Impedance: 48 Ohm
Implantable Lead Implant Date: 20070102
Implantable Lead Implant Date: 20070102
Implantable Lead Implant Date: 20070102
Implantable Lead Location: 753858
Implantable Lead Location: 753859
Implantable Lead Location: 753860
Implantable Lead Model: 4194
Implantable Lead Model: 5076
Implantable Lead Model: 7001
Implantable Pulse Generator Implant Date: 20200519
Lead Channel Impedance Value: 360 Ohm
Lead Channel Impedance Value: 440 Ohm
Lead Channel Impedance Value: 530 Ohm
Lead Channel Pacing Threshold Amplitude: 0.5 V
Lead Channel Pacing Threshold Amplitude: 0.625 V
Lead Channel Pacing Threshold Amplitude: 1 V
Lead Channel Pacing Threshold Pulse Width: 0.5 ms
Lead Channel Pacing Threshold Pulse Width: 0.5 ms
Lead Channel Pacing Threshold Pulse Width: 0.5 ms
Lead Channel Sensing Intrinsic Amplitude: 12 mV
Lead Channel Sensing Intrinsic Amplitude: 3 mV
Lead Channel Setting Pacing Amplitude: 2 V
Lead Channel Setting Pacing Amplitude: 2 V
Lead Channel Setting Pacing Amplitude: 2.5 V
Lead Channel Setting Pacing Pulse Width: 0.5 ms
Lead Channel Setting Pacing Pulse Width: 0.5 ms
Lead Channel Setting Sensing Sensitivity: 2 mV
Pulse Gen Serial Number: 9861706

## 2019-01-06 NOTE — Progress Notes (Signed)
Electrophysiology TeleHealth Note   Due to national recommendations of social distancing due to [place] 19, an audio telehealth visit is felt to be most appropriate for this patient at this time.  Verbal consent was obtained by me for the telehealth visit today.  The patient does not have capability for a virtual visit.  A phone visit is therefore required today.   Date:  01/06/2019   ID:  George Medina, DOB [birth year], MRN [id number]  Location: patient's home  Provider location:  [facility]  Evaluation Performed: Follow-up visit  PCP:  Pomposini, Cherly Anderson, MD   Electrophysiologist:  Dr Rayann Heman  Chief Complaint:  ICD follow up  History of Present Illness:    George Medina is a 85 y.o. male who presents via telehealth conferencing today.  Since last being seen in our clinic, the patient reports doing very well.  Today, he denies symptoms of palpitations, chest pain, shortness of breath,  lower extremity edema, dizziness, presyncope, or syncope.  The patient is otherwise without complaint today.  The patient denies symptoms of fevers, chills, cough, or new SOB worrisome for COVID 19.  Past Medical History:  Diagnosis Date  . A-fib ([place])    anticoagulated with coumadin  . Brain aneurysm 1994   required surgery x2  . CHF (congestive heart failure) (HCC)    chronic-systolic now class I-II  . Complete heart block ([place])   . Diabetes mellitus   . Hypertension   . Nonischemic cardiomyopathy ([place])   . Ventricular tachycardia ([place]) 07/03/2018   CL of 320 msec, ATP terminated    Past Surgical History:  Procedure Laterality Date  . BIV ICD GENERATOR CHANGEOUT N/A 09/20/2018   Procedure: BIV ICD GENERATOR CHANGEOUT;  Surgeon: Thompson Grayer, MD;  Location: [facility];  Service: Cardiovascular;  Laterality: N/A;  . CARDIAC DEFIBRILLATOR PLACEMENT     most recent gen change 5/12,  he has a SJM 7001 lead which was fluroscopically normal during generator change 5/12.  LV and RV  leads were switched in the header  . CEREBRAL ANEURYSM REPAIR     x2    Current Outpatient Medications  Medication Sig Dispense Refill  . albuterol (PROVENTIL HFA;VENTOLIN HFA) 108 (90 BASE) MCG/ACT inhaler Inhale 2 puffs into the lungs every 6 (six) hours as needed for wheezing or shortness of breath.     Marland Kitchen albuterol (PROVENTIL) (2.5 MG/3ML) 0.083% nebulizer solution Take 3 mLs by nebulization daily.     Marland Kitchen aspirin EC 81 MG tablet Take 81 mg by mouth daily.    . carvedilol (COREG) 25 MG tablet Take 25 mg by mouth 2 (two) times daily with a meal.      . cholecalciferol (VITAMIN D) 1000 UNITS tablet Take 1,000 Units by mouth daily.      Marland Kitchen ezetimibe-simvastatin (VYTORIN) 10-40 MG tablet Take 0.5 tablets by mouth daily.     . fluticasone (FLONASE) 50 MCG/ACT nasal spray Place 1 spray into the nose daily as needed for allergies.     . ramipril (ALTACE) 10 MG capsule Take 10 mg by mouth daily.      . TRESIBA FLEXTOUCH 100 UNIT/ML SOPN FlexTouch Pen Inject 35 Units into the skin every morning.      No current facility-administered medications for this visit.     Allergies:   Cefazolin and Penicillins   Social History:  The patient  reports that he quit smoking about 27 years ago. His smoking use included cigarettes. He  has a 60.00 pack-year smoking history. He has never used smokeless tobacco. He reports that he does not drink alcohol or use drugs.   Family History:  [name]  family history includes Coronary artery disease in an other family member; Diabetes in his sister; Heart disease in his brother; Hypertension in his brother.   ROS:  Please see the history of present illness.   All other systems are personally reviewed and negative.    Exam:    Vital Signs:  There were no vitals taken for this visit.  Well sounding and appearing, alert and conversant  Labs/Other Tests and Data Reviewed:    Recent Labs: 09/20/2018: BUN 27; Creatinine, Ser 1.51; Hemoglobin 12.7; Platelets 299;  Potassium 4.1; Sodium 141   Wt Readings from Last 3 Encounters:  09/20/18 165 lb (74.8 kg)  07/08/18 164 lb 12.8 oz (74.8 kg)  04/09/17 166 lb (75.3 kg)     Last device remote is reviewed from [place] PDF which reveals normal device function   ASSESSMENT & PLAN:    1.  Chronic systolic heart failure/ complete heart block See PaceArt report for recent remote Normal device function  2.  VT No recent recurrence  3.  Paroxysmal atrial fibrillation No recent recurrence  4.  HTN Stable No change required today    Follow-up:  [place], 1 year with me    Patient Risk:  after full review of this patients clinical status, I feel that they are at moderate risk at this time.  Today, I have spent 15 minutes with the patient with telehealth technology discussing arrhythmia management .    Army Fossa, MD  01/06/2019 10:59 AM     [facility] [address] [place] [address] [phone] (office) [phone] (fax)

## 2019-01-18 ENCOUNTER — Encounter: Payer: Self-pay | Admitting: Cardiology

## 2019-01-18 NOTE — Progress Notes (Signed)
Remote ICD transmission.   

## 2019-02-06 ENCOUNTER — Ambulatory Visit (INDEPENDENT_AMBULATORY_CARE_PROVIDER_SITE_OTHER): Payer: Medicare Other

## 2019-02-06 DIAGNOSIS — I5022 Chronic systolic (congestive) heart failure: Secondary | ICD-10-CM

## 2019-02-06 DIAGNOSIS — Z95810 Presence of automatic (implantable) cardiac defibrillator: Secondary | ICD-10-CM

## 2019-02-06 NOTE — Progress Notes (Signed)
EPIC Encounter for ICM Monitoring  Patient Name: George Medina is a 85 y.o. male Date: 02/06/2019 Primary Care Physican: Pomposini, Cherly Anderson, MD Primary Cardiologist:Gary Sabra Heck, MD([facility]) Electrophysiologist: Allred Bi-V Pacing: >99% 02/06/2019 Weight:164-168lbs   Spoke with patient. He denies any fluid symptoms and feeling fine.  CorVueThoracic impedancenormal.  Prescribed:Furosemide 20 mg 1 tablet1-2 times a week. He reported Dr Pomposini's office said he could take Furosemide 2 times a week.  Recommendations:   Encouraged to call if experiencing fluid symptoms.  Follow-up plan: ICM clinic phone appointment on 03/13/2019.   91 day device clinic remote transmission 04/07/2019.    Copy of ICM check sent to Dr. Rayann Heman.   3 month ICM trend: 02/06/2019    1 Year ICM trend:       [name], RN 02/06/2019 4:49 PM

## 2019-03-13 ENCOUNTER — Telehealth: Payer: Self-pay

## 2019-03-13 ENCOUNTER — Ambulatory Visit (INDEPENDENT_AMBULATORY_CARE_PROVIDER_SITE_OTHER): Payer: Medicare Other

## 2019-03-13 DIAGNOSIS — Z95810 Presence of automatic (implantable) cardiac defibrillator: Secondary | ICD-10-CM

## 2019-03-13 DIAGNOSIS — I5022 Chronic systolic (congestive) heart failure: Secondary | ICD-10-CM

## 2019-03-13 NOTE — Progress Notes (Signed)
Spoke with patient. He reported he has been feeling more tired than usual in the last 10 days.  He took PRN Furosemide today and will take 1 tablet tomorrow.  Weight stable at 165 lbs and breathing at baseline (he has COPD).  Scheduled remote transmission for 11/17 to recheck fluid levels.

## 2019-03-13 NOTE — Progress Notes (Signed)
EPIC Encounter for ICM Monitoring  Patient Name: George Medina is a 85 y.o. male Date: 03/13/2019 Primary Care Physican: Pomposini, Cherly Anderson, MD Primary Cardiologist:Gary Sabra Heck, MD([facility]) Electrophysiologist: Allred Bi-V Pacing: >99% 02/06/2019 Weight:164-168lbs   Attempted call to patient and unable to reach.  Left message to return call. Transmission reviewed.   CorVueThoracic impedance suggestive of possible fluid accumulation starting 03/12/2019.  Prescribed:Furosemide 20 mg 1 tablet1-2 times a week. He reported Dr Pomposini's office said he could take Furosemide 2 times a week.  Recommendations:  Left voice mail with ICM number and encouraged to call if experiencing any fluid symptoms.  Follow-up plan: ICM clinic phone appointment on 03/21/2019 to recheck fluid levels.   91 day device clinic remote transmission 04/07/2019.    Copy of ICM check sent to Dr. Rayann Heman.   3 month ICM trend: 03/13/2019    1 Year ICM trend:       [name], RN 03/13/2019 11:20 AM

## 2019-03-13 NOTE — Telephone Encounter (Signed)
Remote ICM transmission received.  Attempted call to patient regarding ICM remote transmission and left detailed message per DPR to return call.  Advised to return call for any fluid symptoms or questions.      

## 2019-03-21 ENCOUNTER — Ambulatory Visit (INDEPENDENT_AMBULATORY_CARE_PROVIDER_SITE_OTHER): Payer: Medicare Other

## 2019-03-21 DIAGNOSIS — Z95810 Presence of automatic (implantable) cardiac defibrillator: Secondary | ICD-10-CM

## 2019-03-21 DIAGNOSIS — I5022 Chronic systolic (congestive) heart failure: Secondary | ICD-10-CM

## 2019-03-21 NOTE — Progress Notes (Signed)
EPIC Encounter for ICM Monitoring  Patient Name: George Medina is a 85 y.o. male Date: 03/21/2019 Primary Care Physican: Pomposini, Cherly Anderson, MD Primary Cardiologist:Gary Sabra Heck, MD([facility]) Electrophysiologist: Allred Bi-V Pacing: >99% 10/5/2020Weight:164-168lbs   Spoke with patient.  He has been eating take out food from [employer].  Explained take out foods are typically very high in salt.    CorVueThoracic impedance returned to normal after taking PRN Furosemide on 11/8 but possible fluid accumulation restarted 03/19/2019.  Prescribed:Furosemide 20 mg 1 tablet1-2 times a week. He reported Dr Pomposini's office said he could take Furosemide 2 times a week.  Recommendations: Advised to take 1 PRN Furosemide daily x 2 days then return to PRN.  Encouraged to stop eating restaurant foods if possible.  Follow-up plan: ICM clinic phone appointment on 03/28/2019 to recheck fluid levels.   91 day device clinic remote transmission 04/07/2019.   Copy of ICM check sent to Dr.Allred.   3 month ICM trend: 03/21/2019    1 Year ICM trend:       [name], RN 03/21/2019 12:39 PM

## 2019-03-29 ENCOUNTER — Ambulatory Visit (INDEPENDENT_AMBULATORY_CARE_PROVIDER_SITE_OTHER): Payer: Medicare Other

## 2019-03-29 DIAGNOSIS — Z95810 Presence of automatic (implantable) cardiac defibrillator: Secondary | ICD-10-CM

## 2019-03-29 DIAGNOSIS — I5022 Chronic systolic (congestive) heart failure: Secondary | ICD-10-CM

## 2019-03-29 NOTE — Progress Notes (Signed)
EPIC Encounter for ICM Monitoring  Patient Name: TABIUS TORAIN is a 85 y.o. male Date: 03/29/2019 Primary Care Physican: Pomposini, Cherly Anderson, MD Primary Cardiologist:Gary Sabra Heck, MD([facility]) Electrophysiologist: Allred Bi-V Pacing: >99% 10/5/2020Weight:164-168lbs   Spoke with patient and he is doing well.    CorVueThoracic impedancereturned to normal.  Prescribed:Furosemide 20 mg 1 tablet1-2 times a week. He reported Dr Pomposini's office said he could take Furosemide 2 times a week.  Recommendations:  No changes and encouraged to call if experiencing any fluid symptoms.  Follow-up plan: ICM clinic phone appointment on 04/18/2019.   91 day device clinic remote transmission 04/17/2019.   Copy of ICM check sent to Dr.Allred.  3 month ICM trend: 03/29/2019    1 Year ICM trend:       [name], RN 03/29/2019 8:56 AM

## 2019-04-17 ENCOUNTER — Ambulatory Visit (INDEPENDENT_AMBULATORY_CARE_PROVIDER_SITE_OTHER): Payer: Medicare Other | Admitting: *Deleted

## 2019-04-17 DIAGNOSIS — Z95810 Presence of automatic (implantable) cardiac defibrillator: Secondary | ICD-10-CM | POA: Diagnosis not present

## 2019-04-17 LAB — CUP PACEART REMOTE DEVICE CHECK
Battery Remaining Longevity: 77 mo
Battery Remaining Percentage: 89 %
Battery Voltage: 3.07 V
Brady Statistic AP VP Percent: 3.6 %
Brady Statistic AP VS Percent: 1 %
Brady Statistic AS VP Percent: 96 %
Brady Statistic AS VS Percent: 1 %
Brady Statistic RA Percent Paced: 3.3 %
Date Time Interrogation Session: 20201214020015
HighPow Impedance: 52 Ohm
HighPow Impedance: 52 Ohm
Implantable Lead Implant Date: 20070102
Implantable Lead Implant Date: 20070102
Implantable Lead Implant Date: 20070102
Implantable Lead Location: 753858
Implantable Lead Location: 753859
Implantable Lead Location: 753860
Implantable Lead Model: 4194
Implantable Lead Model: 5076
Implantable Lead Model: 7001
Implantable Pulse Generator Implant Date: 20200519
Lead Channel Impedance Value: 400 Ohm
Lead Channel Impedance Value: 510 Ohm
Lead Channel Impedance Value: 550 Ohm
Lead Channel Pacing Threshold Amplitude: 0.5 V
Lead Channel Pacing Threshold Amplitude: 0.5 V
Lead Channel Pacing Threshold Amplitude: 1 V
Lead Channel Pacing Threshold Pulse Width: 0.5 ms
Lead Channel Pacing Threshold Pulse Width: 0.5 ms
Lead Channel Pacing Threshold Pulse Width: 0.5 ms
Lead Channel Sensing Intrinsic Amplitude: 12 mV
Lead Channel Sensing Intrinsic Amplitude: 2.3 mV
Lead Channel Setting Pacing Amplitude: 2 V
Lead Channel Setting Pacing Amplitude: 2 V
Lead Channel Setting Pacing Amplitude: 2.5 V
Lead Channel Setting Pacing Pulse Width: 0.5 ms
Lead Channel Setting Pacing Pulse Width: 0.5 ms
Lead Channel Setting Sensing Sensitivity: 2 mV
Pulse Gen Serial Number: 9861706

## 2019-04-18 ENCOUNTER — Ambulatory Visit (INDEPENDENT_AMBULATORY_CARE_PROVIDER_SITE_OTHER): Payer: Medicare Other

## 2019-04-18 DIAGNOSIS — I5022 Chronic systolic (congestive) heart failure: Secondary | ICD-10-CM | POA: Diagnosis not present

## 2019-04-18 DIAGNOSIS — Z95810 Presence of automatic (implantable) cardiac defibrillator: Secondary | ICD-10-CM | POA: Diagnosis not present

## 2019-04-19 NOTE — Progress Notes (Signed)
EPIC Encounter for ICM Monitoring  Patient Name: George Medina is a 85 y.o. male Date: 04/19/2019 Primary Care Physican: Pomposini, Cherly Anderson, MD Primary Cardiologist:Gary Sabra Heck, MD([facility]) Electrophysiologist: Allred Bi-V Pacing: >99% 12/16/2020Weight:164-168lbs   Spoke with patient and he is asymptomatic for fluid accumulation.  CorVueThoracic impedancesuggesting possible ongoing fluid accumulation since 04/16/2019.  Prescribed:Furosemide 20 mg 1 tablet1-2 times a week. He reported Dr Pomposini's office said he could take Furosemide 2 times a week.  Recommendations: Advised to take 1 PRN Furosemide tablet x 2 days and then return to PRN.  Advised to limit salt intake.  Follow-up plan: ICM clinic phone appointment on12/23/2020 to recheck fluid levels.91 day device clinic remote transmission 07/17/2019.   Copy of ICM check sent to Dr.Allred.  3 month ICM trend: 04/17/2019    1 Year ICM trend:       [name], RN 04/19/2019 11:40 AM

## 2019-04-26 ENCOUNTER — Ambulatory Visit (INDEPENDENT_AMBULATORY_CARE_PROVIDER_SITE_OTHER): Payer: Medicare Other

## 2019-04-26 DIAGNOSIS — Z95810 Presence of automatic (implantable) cardiac defibrillator: Secondary | ICD-10-CM

## 2019-04-26 DIAGNOSIS — I5022 Chronic systolic (congestive) heart failure: Secondary | ICD-10-CM

## 2019-04-26 NOTE — Progress Notes (Signed)
EPIC Encounter for ICM Monitoring  Patient Name: George Medina is a 85 y.o. male Date: 04/26/2019 Primary Care Physican: Pomposini, Cherly Anderson, MD Primary Cardiologist:Gary Sabra Heck, MD([facility]) Electrophysiologist: Allred Bi-V Pacing: >99% 12/16/2020Weight:164-168lbs   Spoke with patientand he is asymptomatic for fluid accumulation.  CorVueThoracic impedancereturned to normal after taking PRN Furosemide x 2 days.  Prescribed:Furosemide 20 mg 1 tablet1-2 times a week. He reported Dr Pomposini's office said he could take Furosemide 2 times a week.  Recommendations:No changes and encouraged to call if experiencing any fluid symptoms.  Follow-up plan: ICM clinic phone appointment on1/25/2021.91 day device clinic remote transmission 07/17/2019.   Copy of ICM check sent to Dr.Allred.  3 month ICM trend: 04/26/2019    1 Year ICM trend:       [name], RN 04/26/2019 8:38 AM

## 2019-05-20 NOTE — Progress Notes (Signed)
ICD remote 

## 2019-05-29 ENCOUNTER — Ambulatory Visit (INDEPENDENT_AMBULATORY_CARE_PROVIDER_SITE_OTHER): Payer: Medicare Other

## 2019-05-29 DIAGNOSIS — Z95810 Presence of automatic (implantable) cardiac defibrillator: Secondary | ICD-10-CM | POA: Diagnosis not present

## 2019-05-29 DIAGNOSIS — I5022 Chronic systolic (congestive) heart failure: Secondary | ICD-10-CM | POA: Diagnosis not present

## 2019-06-02 NOTE — Progress Notes (Signed)
EPIC Encounter for ICM Monitoring  Patient Name: AGUSTINE GORTNEY is a 86 y.o. male Date: 06/02/2019 Primary Care Physican: Pomposini, Cherly Anderson, MD Primary Cardiologist:Gary Sabra Heck, MD([facility]) Electrophysiologist: Allred Bi-V Pacing: >99% 12/16/2020Weight:164-168lbs   Spoke with patientand he isasymptomatic for fluid accumulation.  CorVueThoracic impedancenormal.  Prescribed:Furosemide 20 mg 1 tablet1-2 times a week. He reported Dr Pomposini's office said he could take Furosemide 2 times a week.  Recommendations:No changes and encouraged to call if experiencing any fluid symptoms.  Follow-up plan: ICM clinic phone appointment on3/05/2019.91 day device clinic remote transmission3/15/2021.  Copy of ICM check sent to Dr.Allred.   3 month ICM trend: 05/29/2019    1 Year ICM trend:       [name], RN 06/02/2019 9:03 AM

## 2019-07-04 ENCOUNTER — Telehealth: Payer: Self-pay

## 2019-07-04 NOTE — Telephone Encounter (Signed)
Left message for patient to remind of missed remote transmission.  

## 2019-07-17 ENCOUNTER — Ambulatory Visit (INDEPENDENT_AMBULATORY_CARE_PROVIDER_SITE_OTHER): Payer: Medicare Other | Admitting: *Deleted

## 2019-07-17 DIAGNOSIS — Z95810 Presence of automatic (implantable) cardiac defibrillator: Secondary | ICD-10-CM | POA: Diagnosis not present

## 2019-07-17 LAB — CUP PACEART REMOTE DEVICE CHECK
Battery Remaining Longevity: 76 mo
Battery Remaining Percentage: 86 %
Battery Voltage: 3.02 V
Brady Statistic AP VP Percent: 4.9 %
Brady Statistic AP VS Percent: 1 %
Brady Statistic AS VP Percent: 95 %
Brady Statistic AS VS Percent: 1 %
Brady Statistic RA Percent Paced: 4.6 %
Date Time Interrogation Session: 20210315031010
HighPow Impedance: 54 Ohm
HighPow Impedance: 55 Ohm
Implantable Lead Implant Date: 20070102
Implantable Lead Implant Date: 20070102
Implantable Lead Implant Date: 20070102
Implantable Lead Location: 753858
Implantable Lead Location: 753859
Implantable Lead Location: 753860
Implantable Lead Model: 4194
Implantable Lead Model: 5076
Implantable Lead Model: 7001
Implantable Pulse Generator Implant Date: 20200519
Lead Channel Impedance Value: 410 Ohm
Lead Channel Impedance Value: 490 Ohm
Lead Channel Impedance Value: 650 Ohm
Lead Channel Pacing Threshold Amplitude: 0.5 V
Lead Channel Pacing Threshold Amplitude: 0.5 V
Lead Channel Pacing Threshold Amplitude: 1 V
Lead Channel Pacing Threshold Pulse Width: 0.5 ms
Lead Channel Pacing Threshold Pulse Width: 0.5 ms
Lead Channel Pacing Threshold Pulse Width: 0.5 ms
Lead Channel Sensing Intrinsic Amplitude: 12 mV
Lead Channel Sensing Intrinsic Amplitude: 2.9 mV
Lead Channel Setting Pacing Amplitude: 2 V
Lead Channel Setting Pacing Amplitude: 2 V
Lead Channel Setting Pacing Amplitude: 2.5 V
Lead Channel Setting Pacing Pulse Width: 0.5 ms
Lead Channel Setting Pacing Pulse Width: 0.5 ms
Lead Channel Setting Sensing Sensitivity: 2 mV
Pulse Gen Serial Number: 9861706

## 2019-07-17 NOTE — Progress Notes (Signed)
ICD Remote  

## 2019-07-18 ENCOUNTER — Ambulatory Visit (INDEPENDENT_AMBULATORY_CARE_PROVIDER_SITE_OTHER): Payer: Medicare Other

## 2019-07-18 DIAGNOSIS — I5022 Chronic systolic (congestive) heart failure: Secondary | ICD-10-CM | POA: Diagnosis not present

## 2019-07-18 DIAGNOSIS — Z95810 Presence of automatic (implantable) cardiac defibrillator: Secondary | ICD-10-CM | POA: Diagnosis not present

## 2019-07-21 NOTE — Progress Notes (Signed)
EPIC Encounter for ICM Monitoring  Patient Name: George Medina is a 86 y.o. male Date: 07/21/2019 Primary Care Physican: Pomposini, Cherly Anderson, MD Primary Cardiologist:Gary Sabra Heck, MD([facility]) Electrophysiologist: Allred Bi-V Pacing: >99% Last Weight:164-168lbs   Transmission Reviewed.  CorVueThoracic impedancenormal.  Prescribed:Furosemide 20 mg 1 tablet1-2 times a week. He reported Dr Pomposini's office said he could take Furosemide 2 times a week.  Recommendations:None  Follow-up plan: ICM clinic phone appointment on4/19/2021.91 day device clinic remote transmission6/14/2021.  Copy of ICM check sent to Dr.Allred.   3 month ICM trend: 07/17/2019    1 Year ICM trend:       [name], RN 07/21/2019 4:48 PM

## 2019-08-23 NOTE — Progress Notes (Signed)
No ICM remote transmission received for 08/21/2019 and next ICM transmission scheduled for 09/18/2019.

## 2019-09-02 DEATH — deceased

## 2019-09-04 ENCOUNTER — Telehealth: Payer: Self-pay | Admitting: *Deleted

## 2019-09-04 NOTE — Telephone Encounter (Signed)
Father passed away this past Saturday.  Wants to know what to do with the [employer] at home transmitter.

## 2019-09-04 NOTE — Telephone Encounter (Signed)
Called Marla (DPR) and informed her she can call [employer] and they will send out return kit. Will email phone number for her assistance.   [place]: [phone]

## 2019-09-05 ENCOUNTER — Telehealth: Payer: Self-pay

## 2019-09-05 NOTE — Telephone Encounter (Signed)
The pt daughter called wanting to know what to do with his home monitor. I told her I will order the return kit. I marked him deceased in [place] and marked him deceased in [place].

## 2019-10-03 DEATH — deceased
